# Patient Record
Sex: Male | Born: 1960 | Race: White | Hispanic: No | Marital: Married | Smoking: Never smoker
Health system: Southern US, Community
[De-identification: ages and names within clinical notes are randomized; demographics above are authoritative.]

## PROBLEM LIST (undated history)

## (undated) DIAGNOSIS — J45909 Unspecified asthma, uncomplicated: Secondary | ICD-10-CM

## (undated) DIAGNOSIS — K5190 Ulcerative colitis, unspecified, without complications: Secondary | ICD-10-CM

## (undated) DIAGNOSIS — E119 Type 2 diabetes mellitus without complications: Secondary | ICD-10-CM

## (undated) HISTORY — PX: UMBILICAL HERNIA REPAIR: SHX196

## (undated) HISTORY — PX: HERNIA REPAIR: SHX51

## (undated) HISTORY — PX: OTHER SURGICAL HISTORY: SHX169

---

## 2006-04-22 ENCOUNTER — Ambulatory Visit (HOSPITAL_BASED_OUTPATIENT_CLINIC_OR_DEPARTMENT_OTHER): Admission: RE | Admit: 2006-04-22 | Discharge: 2006-04-22 | Payer: Self-pay | Admitting: Surgery

## 2006-04-22 ENCOUNTER — Ambulatory Visit (HOSPITAL_COMMUNITY): Admission: AD | Admit: 2006-04-22 | Discharge: 2006-04-23 | Payer: Self-pay | Admitting: Surgery

## 2010-12-03 ENCOUNTER — Emergency Department (HOSPITAL_COMMUNITY): Payer: BC Managed Care – PPO

## 2010-12-03 ENCOUNTER — Observation Stay (HOSPITAL_COMMUNITY)
Admission: EM | Admit: 2010-12-03 | Discharge: 2010-12-05 | Disposition: A | Discharge status: Home / Self Care | Payer: BC Managed Care – PPO | Source: Ambulatory Visit | Attending: Infectious Diseases | Admitting: Infectious Diseases

## 2010-12-03 DIAGNOSIS — R197 Diarrhea, unspecified: Secondary | ICD-10-CM | POA: Insufficient documentation

## 2010-12-03 DIAGNOSIS — K2940 Chronic atrophic gastritis without bleeding: Secondary | ICD-10-CM | POA: Insufficient documentation

## 2010-12-03 DIAGNOSIS — R609 Edema, unspecified: Secondary | ICD-10-CM | POA: Insufficient documentation

## 2010-12-03 DIAGNOSIS — K921 Melena: Secondary | ICD-10-CM

## 2010-12-03 DIAGNOSIS — K259 Gastric ulcer, unspecified as acute or chronic, without hemorrhage or perforation: Principal | ICD-10-CM | POA: Insufficient documentation

## 2010-12-03 DIAGNOSIS — R1013 Epigastric pain: Secondary | ICD-10-CM | POA: Insufficient documentation

## 2010-12-03 DIAGNOSIS — R5381 Other malaise: Secondary | ICD-10-CM | POA: Insufficient documentation

## 2010-12-03 DIAGNOSIS — K922 Gastrointestinal hemorrhage, unspecified: Secondary | ICD-10-CM

## 2010-12-03 DIAGNOSIS — M2550 Pain in unspecified joint: Secondary | ICD-10-CM | POA: Insufficient documentation

## 2010-12-03 DIAGNOSIS — J45909 Unspecified asthma, uncomplicated: Secondary | ICD-10-CM | POA: Insufficient documentation

## 2010-12-03 DIAGNOSIS — Z23 Encounter for immunization: Secondary | ICD-10-CM | POA: Insufficient documentation

## 2010-12-03 LAB — CBC
HCT: 41.3 % (ref 39.0–52.0)
Hemoglobin: 14.7 g/dL (ref 13.0–17.0)
WBC: 7.5 10*3/uL (ref 4.0–10.5)

## 2010-12-03 LAB — COMPREHENSIVE METABOLIC PANEL
ALT: 16 U/L (ref 0–53)
AST: 29 U/L (ref 0–37)
Calcium: 9.1 mg/dL (ref 8.4–10.5)
GFR calc Af Amer: >60 mL/min (ref >60)
Glucose, Bld: 96 mg/dL (ref 70–99)
Sodium: 140 mEq/L (ref 135–145)
Total Protein: 7.3 g/dL (ref 6.0–8.3)

## 2010-12-03 LAB — POCT CARDIAC MARKERS
CKMB, poc: <1 ng/mL — ABNORMAL LOW (ref 1.0–8.0)
Myoglobin, poc: 54.2 ng/mL (ref 12–200)
Troponin i, poc: <0.05 ng/mL (ref 0.00–0.09)

## 2010-12-03 LAB — DIFFERENTIAL
Basophils Absolute: 0 10*3/uL (ref 0.0–0.1)
Lymphocytes Relative: 29 % (ref 12–46)
Neutro Abs: 4.4 10*3/uL (ref 1.7–7.7)

## 2010-12-03 LAB — TYPE AND SCREEN

## 2010-12-03 LAB — ABO/RH: ABO/RH(D): A NEG

## 2010-12-03 LAB — HIV ANTIBODY (ROUTINE TESTING W REFLEX): HIV: NONREACTIVE

## 2010-12-03 LAB — LIPASE, BLOOD: Lipase: 30 U/L (ref 11–59)

## 2010-12-04 ENCOUNTER — Other Ambulatory Visit: Payer: Self-pay | Admitting: Gastroenterology

## 2010-12-04 LAB — CARDIAC PANEL(CRET KIN+CKTOT+MB+TROPI)
Relative Index: INVALID (ref 0.0–2.5)
Total CK: 77 U/L (ref 7–232)
Troponin I: 0.01 ng/mL (ref 0.00–0.06)

## 2010-12-04 LAB — RAPID URINE DRUG SCREEN, HOSP PERFORMED
Amphetamines: NOT DETECTED
Barbiturates: NOT DETECTED
Cocaine: NOT DETECTED
Tetrahydrocannabinol: NOT DETECTED

## 2010-12-04 LAB — CBC
HCT: 38.7 % — ABNORMAL LOW (ref 39.0–52.0)
Hemoglobin: 13.2 g/dL (ref 13.0–17.0)
MCHC: 34.1 g/dL (ref 30.0–36.0)
RDW: 13.1 % (ref 11.5–15.5)

## 2010-12-04 LAB — BASIC METABOLIC PANEL
BUN: 6 mg/dL (ref 6–23)
CO2: 29 mEq/L (ref 19–32)
Chloride: 106 mEq/L (ref 96–112)
Creatinine, Ser: 1.06 mg/dL (ref 0.4–1.5)
Glucose, Bld: 104 mg/dL — ABNORMAL HIGH (ref 70–99)

## 2010-12-04 LAB — IGM: IgM, Serum: 52 mg/dL — ABNORMAL LOW (ref 60–263)

## 2010-12-05 LAB — CBC
MCH: 30.1 pg (ref 26.0–34.0)
MCHC: 34.4 g/dL (ref 30.0–36.0)
MCV: 87.3 fL (ref 78.0–100.0)
Platelets: 121 10*3/uL — ABNORMAL LOW (ref 150–400)
RDW: 13 % (ref 11.5–15.5)

## 2010-12-10 NOTE — Op Note (Signed)
  [name], [name]               ACCOUNT NO.:  [id number]  MEDICAL RECORD NO.:  [id number]           PATIENT TYPE:  I  LOCATION:  5019                         FACILITY:  [facility]  PHYSICIAN:  Elnoria Livingston L. Malon Kindle., M.D.DATE OF BIRTH:  1961/07/27  DATE OF PROCEDURE:  12/04/2010 DATE OF DISCHARGE:                              OPERATIVE REPORT   PROCEDURE:  Esophagogastroduodenoscopy and biopsy.  SURGEON:  Christoper Bushey L. Jermiya Reichl, MD  MEDICATIONS:  Cetacaine spray, fentanyl 100 mcg, Versed 8 mg IV.  INDICATIONS:  This is an young man who has been taking a lot of Aleve. He has had some maroonish stools with burning epigastric discomfort.  DESCRIPTION OF PROCEDURE:  Procedure had been explained to the patient and consent obtained.  In the left lateral decubitus position, the Pentax upper scope was inserted and advanced under direct visualization. The esophagus was normal.  There was no ulceration or inflammation or signs of esophagitis and no esophageal varices.  The stomach was entered.  There was no active bleeding in the stomach.  At the antral area were multiple linear shallow-type ulcers.  None were actively bleeding.  Several were removed with biopsies at the edges.  The pyloric channel was normal and the duodenum was normal as well.  The scope was withdrawn and initial findings were confirmed.  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  PLAN:  We will have the patient avoid Aleve and other nonsteroidal and we will treat him for several weeks with proton pump inhibitors and follow up in 6-8 weeks with repeat EGD or upper GI.          ______________________________ [name]. Malon Kindle., M.D.     Waldron Session  D:  12/04/2010  T:  12/05/2010  Job:  [id number]  cc:   Aida Puffer  Electronically Signed by Carman Ching M.D. on 12/10/2010 03:20:12 PM

## 2010-12-12 NOTE — Consult Note (Signed)
  [name], [name]               ACCOUNT NO.:  [id number]  MEDICAL RECORD NO.:  [id number]           PATIENT TYPE:  LOCATION:                                 FACILITY:  PHYSICIAN:  Keigo Whalley L. Malon Kindle., M.D.DATE OF BIRTH:  1961/10/07  DATE OF CONSULTATION:  12/04/2010 DATE OF DISCHARGE:                                CONSULTATION   Requested by medical teaching service.  PRIMARY CARE PHYSICIAN:  Dr. Aida Puffer.  REASON FOR CONSULTATION:  GI bleeding.  HISTORY:  The patient came to the emergency room with several loose bowel movements with severe epigastric burning and bloating.  He apparently has had several days of melenic stools and has been using Aleve.  For the past 6 to 8 months, he has had progressive aches and pains, has been taking Aleve 3 to 5 days, approximately 4 tablets a day. He has had an episode of melena and some bright blood a couple of months ago but has had black and maroony stools over the past several days, 3 to 4 bowel movements a day.  He has had some mild fatigue.  He has not really been on any other medications other than albuterol and Flovent inhalers p.r.n. for asthma.  His hemoglobin on admission was 14.7, dropped to 13.2 this morning.  The patient's stools were apparently guaiac negative on exam.  He has been started on Zofran and Protonix. He has had burning epigastric pain for the past several weeks intermittently, it comes and goes.  CURRENT MEDICATIONS:  Flovent and albuterol inhalers p.r.n.  ALLERGIES:  He has no drug allergies.  MEDICAL HISTORY:  He has had a previous umbilical hernia repair with mesh and has intermittent asthma.  No other chronic medical problems.  SOCIAL HISTORY:  He drinks 2 to 3 beers a day, he has never smoked. Works as a Landscape architect.  He is married.  FAMILY HISTORY:  The family history is significant for coronary artery disease, hypertension, dialysis in his mother.  No family history of GI cancer or  IBD.  PHYSICAL EXAMINATION:  VITAL SIGNS:  Currently stable. GENERAL:  White male in no acute distress. HEENT:  Sclerae nonicteric. LUNGS:  Clear. HEART:  Regular rate and rhythm without murmurs or gallops. ABDOMEN:  Soft, slightly distended, obese with good bowel sounds.  He has diastasis recti and has tenderness right in that area, although there is no incarceration of the hernia.  ASSESSMENT:  Abdominal pain and recent melena - this is probably an nonsteroidal antiinflammatory drug induced ulcer.  PLAN:  We will go ahead with an endoscopy today and we keep him n.p.o. for now.  I discussed this with the patient, he is agreeable.          ______________________________ [name]. Malon Kindle., M.D.     Waldron Session  D:  12/04/2010  T:  12/05/2010  Job:  [id number]  cc:   Aida Puffer  Electronically Signed by Carman Ching M.D. on 12/12/2010 01:40:57 PM
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
## 2011-02-05 ENCOUNTER — Other Ambulatory Visit: Payer: Self-pay | Admitting: Gastroenterology

## 2011-02-28 ENCOUNTER — Ambulatory Visit
Admission: RE | Admit: 2011-02-28 | Discharge: 2011-02-28 | Disposition: A | Discharge status: Home / Self Care | Payer: BC Managed Care – PPO | Source: Ambulatory Visit | Attending: Gastroenterology | Admitting: Gastroenterology

## 2011-02-28 ENCOUNTER — Other Ambulatory Visit: Payer: Self-pay | Admitting: Gastroenterology

## 2011-02-28 DIAGNOSIS — K5900 Constipation, unspecified: Secondary | ICD-10-CM

## 2011-06-19 ENCOUNTER — Inpatient Hospital Stay (HOSPITAL_COMMUNITY)
Admission: AD | Admit: 2011-06-19 | Discharge: 2011-06-22 | DRG: 179 | Disposition: A | Discharge status: Home / Self Care | Payer: BC Managed Care – PPO | Source: Ambulatory Visit | Attending: Gastroenterology | Admitting: Gastroenterology

## 2011-06-19 DIAGNOSIS — J45909 Unspecified asthma, uncomplicated: Secondary | ICD-10-CM | POA: Diagnosis present

## 2011-06-19 DIAGNOSIS — IMO0002 Reserved for concepts with insufficient information to code with codable children: Secondary | ICD-10-CM

## 2011-06-19 DIAGNOSIS — K5100 Ulcerative (chronic) pancolitis without complications: Principal | ICD-10-CM | POA: Diagnosis present

## 2011-06-19 DIAGNOSIS — R110 Nausea: Secondary | ICD-10-CM | POA: Diagnosis present

## 2011-06-19 DIAGNOSIS — R197 Diarrhea, unspecified: Secondary | ICD-10-CM | POA: Diagnosis present

## 2011-06-19 DIAGNOSIS — Z8711 Personal history of peptic ulcer disease: Secondary | ICD-10-CM

## 2011-06-19 LAB — COMPREHENSIVE METABOLIC PANEL
Alkaline Phosphatase: 44 U/L (ref 39–117)
BUN: 15 mg/dL (ref 6–23)
CO2: 29 mEq/L (ref 19–32)
Chloride: 104 mEq/L (ref 96–112)
GFR calc Af Amer: >60 mL/min (ref >60)
Glucose, Bld: 95 mg/dL (ref 70–99)
Potassium: 3.9 mEq/L (ref 3.5–5.1)
Total Bilirubin: 0.3 mg/dL (ref 0.3–1.2)

## 2011-06-19 LAB — CBC
HCT: 36.8 % — ABNORMAL LOW (ref 39.0–52.0)
Hemoglobin: 12 g/dL — ABNORMAL LOW (ref 13.0–17.0)
WBC: 6.5 10*3/uL (ref 4.0–10.5)

## 2011-06-20 LAB — CLOSTRIDIUM DIFFICILE BY PCR: Toxigenic C. Difficile by PCR: NEGATIVE

## 2011-06-22 LAB — CBC
HCT: 41.8 % (ref 39.0–52.0)
MCHC: 32.8 g/dL (ref 30.0–36.0)
RDW: 15.1 % (ref 11.5–15.5)

## 2011-07-03 NOTE — H&P (Signed)
  [name], [name] NO.:  [id number]  MEDICAL RECORD NO.:  [id number]  LOCATION:  5122                         FACILITY:  [facility]  PHYSICIAN:  Graylin Shiver, M.D.   DATE OF BIRTH:  [birth year]  DATE OF ADMISSION:  06/19/2011 DATE OF DISCHARGE:                             HISTORY & PHYSICAL   CHIEF COMPLAINT:  Ulcerative colitis.  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 Randa Evens.  He has been off on Apriso for his ulcerative colitis and also prednisone.  He has been having what was felt to be a flare-up with associated diarrhea and because of this Dr. Randa Evens started him on prednisone 20 mg a few weeks ago and this seemed to help his symptoms, however, he tapered the dosage down to 5 mg and as he was tapering down his symptoms of diarrhea got worse and also a couple of days ago he started noticing some rectal bleeding.  He has all some and having some lower abdominal cramping discomfort.  Because of this, Dr. Randa Evens called and felt that the patient should be admitted to the hospital for further treatment.  PAST HISTORY: 1. Peptic ulcer disease. 2. Asthma. 3. Universal ulcerative colitis.  PAST SURGERIES:  Hernia repair.  MEDICATIONS: 1. Glucosamine 1500 complex capsule once a day. 2. Prednisone, he had gotten down to 5 mg daily, but yesterday he took     20 mg. 3. Apriso 0.375 g capsule extended 4 capsules every morning once a     day.  FAMILY HISTORY:  Negative for any GI problems.  SOCIAL HISTORY:  Does not smoke.  Drinks occasional alcohol.  ALLERGIES:  OMEPRAZOLE causes itching.  REVIEW OF SYSTEMS:  No chest pain, shortness of breath, cough, or sputum production.  PHYSICAL EXAMINATION:  GENERAL:  He is alert and oriented.  He does not appear in any acute distress.  He was directly admitted and is ambulatory here in the  hospital. SKIN:  Nonicteric. HEART:  Regular rhythm.  No murmurs. LUNGS:  Clear. ABDOMEN:  Bowel sounds normal, soft, nontender.  No hepatosplenomegaly. EXTREMITIES:  No edema.  IMPRESSION:  Ulcerative colitis with flare-up.  PLAN:  The patient is being admitted to the hospital for more aggressive treatment.  This will include IV fluids and also I will start him on Solu-Medrol 40 mg IV q.8 h. until his symptoms seemed to improve and we will follow him clinically.          ______________________________ Graylin Shiver, M.D.     SFG/MEDQ  D:  06/19/2011  T:  06/19/2011  Job:  [id number]  cc:   Fayrene Fearing L. Malon Kindle., M.D. Aida Puffer, MD  Electronically Signed by Herbert Moors MD on 07/03/2011 08:55:30 AM

## 2011-07-10 NOTE — Discharge Summary (Signed)
  [name], COVEN NO.:  [id number]  MEDICAL RECORD NO.:  [id number]  LOCATION:  5122                         FACILITY:  [facility]  PHYSICIAN:  Shirley Friar, MDDATE OF BIRTH:  May 01, 1961  DATE OF ADMISSION:  06/19/2011 DATE OF DISCHARGE:  06/22/2011                              DISCHARGE SUMMARY   DISCHARGE DIAGNOSIS:  Ulcerative colitis flare.  HISTORY OF PRESENT ILLNESS:  [name] is a 50 year old white male, who was diagnosed of universal ulcerative colitis in April 2012 by Dr. Randa Evens and had been on Apriso 1.5 grams per day.  He had been on prednisone taper that started few weeks ago when he developed a recurrent symptoms and when tapering down to 5 mg.  He developed severe diarrhea and some rectal bleeding.  He was admitted for further management of this presumed ulcerative colitis flare.  HOSPITAL COURSE:  On presentation, he had a normal white blood count of 6.5, hemoglobin of 12.0, and platelet count of 157.  His electrolytes were all within normal limits.  He was placed on Solu-Medrol 40 mg IV q.8 h. and aggressive IV fluids.  He had a negative C. diff PCR.  He did well with IV steroids and IV fluids and his diarrhea resolved. On day of discharge, he was having formed stools without any abdominal pain, nausea, and vomiting.  He was tolerating a bland diet on the day of discharge.  Repeat CBC will be ordered prior to discharge and that is pending at this time.  DISCHARGE CONDITION:  Improved.  DISCHARGE ACTIVITY:  Increase activity slowly.  DISCHARGE DIET:  Low-fat diet/bland diet.  DISCHARGE MEDICATIONS: 1. Prednisone 60 mg p.o. daily. 2. Apriso 1.5 g p.o. daily. 3. Ondansetron 4 mg p.o. q.8 h. as needed.  DISCHARGE INSTRUCTIONS:  Return to work in 1 week as tolerated.  DISCHARGE FOLLOWUP:  Follow up with Dr. Carman Ching at [facility] in 1-2 weeks.  Discharge to home.     Shirley Friar,  MD     VCS/MEDQ  D:  06/22/2011  T:  06/22/2011  Job:  [id number]  cc:   Fayrene Fearing L. Malon Kindle., M.D. Graylin Shiver, M.D.  Electronically Signed by Charlott Rakes MD on 07/10/2011 10:55:10 AM

## 2012-08-19 ENCOUNTER — Other Ambulatory Visit (HOSPITAL_COMMUNITY): Payer: Self-pay | Admitting: *Deleted

## 2012-08-20 ENCOUNTER — Encounter (HOSPITAL_COMMUNITY)
Admission: RE | Admit: 2012-08-20 | Discharge: 2012-08-20 | Disposition: A | Discharge status: Home / Self Care | Payer: BC Managed Care – PPO | Source: Ambulatory Visit | Attending: Gastroenterology | Admitting: Gastroenterology

## 2012-08-20 DIAGNOSIS — K5100 Ulcerative (chronic) pancolitis without complications: Secondary | ICD-10-CM | POA: Insufficient documentation

## 2012-08-20 MED ORDER — ACETAMINOPHEN 325 MG PO TABS
ORAL_TABLET | ORAL | Status: AC
  Administered 2012-08-20: 350 mg
  Filled 2012-08-20: qty 2

## 2012-08-20 MED ORDER — SODIUM CHLORIDE 0.9 % IV SOLN
700.0000 mg | INTRAVENOUS | Status: DC
  Administered 2012-08-20: 700 mg via INTRAVENOUS
  Filled 2012-08-20: qty 70

## 2012-08-20 MED ORDER — ACETAMINOPHEN 500 MG PO TABS: 1000.0000 mg | ORAL_TABLET | ORAL | Status: DC

## 2012-08-20 MED ORDER — SODIUM CHLORIDE 0.9 % IV SOLN
INTRAVENOUS | Status: DC
  Administered 2012-08-20: 09:00:00 via INTRAVENOUS

## 2012-09-02 ENCOUNTER — Other Ambulatory Visit (HOSPITAL_COMMUNITY): Payer: Self-pay | Admitting: *Deleted

## 2012-09-03 ENCOUNTER — Encounter (HOSPITAL_COMMUNITY)
Admission: RE | Admit: 2012-09-03 | Discharge: 2012-09-03 | Disposition: A | Discharge status: Home / Self Care | Payer: BC Managed Care – PPO | Source: Ambulatory Visit | Attending: Gastroenterology | Admitting: Gastroenterology

## 2012-09-03 DIAGNOSIS — K5100 Ulcerative (chronic) pancolitis without complications: Secondary | ICD-10-CM | POA: Insufficient documentation

## 2012-09-03 MED ORDER — SODIUM CHLORIDE 0.9 % IV SOLN
INTRAVENOUS | Status: AC
  Administered 2012-09-03: 10:00:00 via INTRAVENOUS

## 2012-09-03 MED ORDER — SODIUM CHLORIDE 0.9 % IV SOLN
700.0000 mg | INTRAVENOUS | Status: AC
  Administered 2012-09-03: 700 mg via INTRAVENOUS
  Filled 2012-09-03: qty 70

## 2012-09-03 MED ORDER — ACETAMINOPHEN 500 MG PO TABS
ORAL_TABLET | ORAL | Status: AC
  Administered 2012-09-03: 1000 mg via ORAL
  Filled 2012-09-03: qty 2

## 2012-09-03 MED ORDER — ACETAMINOPHEN 500 MG PO TABS
1000.0000 mg | ORAL_TABLET | ORAL | Status: AC
  Administered 2012-09-03: 1000 mg via ORAL

## 2012-10-15 ENCOUNTER — Other Ambulatory Visit (HOSPITAL_COMMUNITY): Payer: Self-pay | Admitting: *Deleted

## 2012-10-16 ENCOUNTER — Encounter (HOSPITAL_COMMUNITY)
Admission: RE | Admit: 2012-10-16 | Discharge: 2012-10-16 | Disposition: A | Discharge status: Home / Self Care | Payer: BC Managed Care – PPO | Source: Ambulatory Visit | Attending: Gastroenterology | Admitting: Gastroenterology

## 2012-10-16 DIAGNOSIS — K5100 Ulcerative (chronic) pancolitis without complications: Secondary | ICD-10-CM | POA: Insufficient documentation

## 2012-10-16 MED ORDER — SODIUM CHLORIDE 0.9 % IV SOLN
700.0000 mg | INTRAVENOUS | Status: DC
  Administered 2012-10-16: 700 mg via INTRAVENOUS
  Filled 2012-10-16: qty 70

## 2012-10-16 MED ORDER — ACETAMINOPHEN 500 MG PO TABS
1000.0000 mg | ORAL_TABLET | ORAL | Status: DC
  Administered 2012-10-16: 1000 mg via ORAL

## 2012-10-16 MED ORDER — SODIUM CHLORIDE 0.9 % IV SOLN
INTRAVENOUS | Status: DC
  Administered 2012-10-16: 250 mL via INTRAVENOUS

## 2012-10-16 MED ORDER — ACETAMINOPHEN 500 MG PO TABS
ORAL_TABLET | ORAL | Status: AC
  Filled 2012-10-16: qty 2

## 2012-12-17 ENCOUNTER — Other Ambulatory Visit (HOSPITAL_COMMUNITY): Payer: Self-pay | Admitting: *Deleted

## 2012-12-18 ENCOUNTER — Encounter (HOSPITAL_COMMUNITY)
Admission: RE | Admit: 2012-12-18 | Discharge: 2012-12-18 | Disposition: A | Discharge status: Home / Self Care | Payer: BC Managed Care – PPO | Source: Ambulatory Visit | Attending: Gastroenterology | Admitting: Gastroenterology

## 2012-12-18 DIAGNOSIS — K5100 Ulcerative (chronic) pancolitis without complications: Secondary | ICD-10-CM | POA: Insufficient documentation

## 2012-12-18 MED ORDER — SODIUM CHLORIDE 0.9 % IV SOLN
700.0000 mg | INTRAVENOUS | Status: AC
  Administered 2012-12-18: 700 mg via INTRAVENOUS
  Filled 2012-12-18: qty 70

## 2012-12-18 MED ORDER — ACETAMINOPHEN 500 MG PO TABS: 1000.0000 mg | ORAL_TABLET | ORAL | Status: DC

## 2012-12-18 MED ORDER — SODIUM CHLORIDE 0.9 % IV SOLN: INTRAVENOUS | Status: DC

## 2012-12-18 MED ORDER — ACETAMINOPHEN 500 MG PO TABS
ORAL_TABLET | ORAL | Status: AC
  Filled 2012-12-18: qty 2

## 2012-12-18 MED ORDER — SODIUM CHLORIDE 0.9 % IV SOLN: 700.0000 mg | INTRAVENOUS | Status: DC

## 2012-12-18 MED ORDER — ACETAMINOPHEN 500 MG PO TABS
1000.0000 mg | ORAL_TABLET | ORAL | Status: AC
Start: 2012-12-18 — End: 2012-12-18
  Administered 2012-12-18: 1000 mg via ORAL

## 2012-12-29 ENCOUNTER — Emergency Department (HOSPITAL_COMMUNITY): Payer: BC Managed Care – PPO

## 2012-12-29 ENCOUNTER — Encounter (HOSPITAL_COMMUNITY): Payer: Self-pay | Admitting: Emergency Medicine

## 2012-12-29 ENCOUNTER — Emergency Department (HOSPITAL_COMMUNITY)
Admission: EM | Admit: 2012-12-29 | Discharge: 2012-12-29 | Disposition: A | Discharge status: Home / Self Care | Payer: BC Managed Care – PPO | Attending: Emergency Medicine | Admitting: Emergency Medicine

## 2012-12-29 DIAGNOSIS — N1330 Unspecified hydronephrosis: Secondary | ICD-10-CM | POA: Insufficient documentation

## 2012-12-29 DIAGNOSIS — N201 Calculus of ureter: Secondary | ICD-10-CM | POA: Insufficient documentation

## 2012-12-29 DIAGNOSIS — K5190 Ulcerative colitis, unspecified, without complications: Secondary | ICD-10-CM | POA: Insufficient documentation

## 2012-12-29 DIAGNOSIS — R42 Dizziness and giddiness: Secondary | ICD-10-CM | POA: Insufficient documentation

## 2012-12-29 DIAGNOSIS — M549 Dorsalgia, unspecified: Secondary | ICD-10-CM | POA: Insufficient documentation

## 2012-12-29 DIAGNOSIS — J45909 Unspecified asthma, uncomplicated: Secondary | ICD-10-CM | POA: Insufficient documentation

## 2012-12-29 DIAGNOSIS — Z9889 Other specified postprocedural states: Secondary | ICD-10-CM | POA: Insufficient documentation

## 2012-12-29 DIAGNOSIS — R110 Nausea: Secondary | ICD-10-CM | POA: Insufficient documentation

## 2012-12-29 DIAGNOSIS — IMO0002 Reserved for concepts with insufficient information to code with codable children: Secondary | ICD-10-CM | POA: Insufficient documentation

## 2012-12-29 DIAGNOSIS — Z79899 Other long term (current) drug therapy: Secondary | ICD-10-CM | POA: Insufficient documentation

## 2012-12-29 HISTORY — DX: Ulcerative colitis, unspecified, without complications: K51.90

## 2012-12-29 HISTORY — DX: Unspecified asthma, uncomplicated: J45.909

## 2012-12-29 LAB — BASIC METABOLIC PANEL
GFR calc Af Amer: >90 mL/min (ref >90)
GFR calc non Af Amer: >90 mL/min (ref >90)
Glucose, Bld: 113 mg/dL — ABNORMAL HIGH (ref 70–99)
Potassium: 4.2 mEq/L (ref 3.5–5.1)
Sodium: 139 mEq/L (ref 135–145)

## 2012-12-29 LAB — URINE MICROSCOPIC-ADD ON

## 2012-12-29 LAB — URINALYSIS, ROUTINE W REFLEX MICROSCOPIC
Leukocytes, UA: NEGATIVE
Nitrite: NEGATIVE
Specific Gravity, Urine: 1.017 (ref 1.005–1.030)
Urobilinogen, UA: 0.2 mg/dL (ref 0.0–1.0)

## 2012-12-29 LAB — CBC WITH DIFFERENTIAL/PLATELET
Basophils Absolute: 0 10*3/uL (ref 0.0–0.1)
Eosinophils Absolute: 0.5 10*3/uL (ref 0.0–0.7)
Lymphs Abs: 1.5 10*3/uL (ref 0.7–4.0)
MCH: 32.2 pg (ref 26.0–34.0)
Neutrophils Relative %: 50 % (ref 43–77)
Platelets: DECREASED 10*3/uL (ref 150–400)
RBC: 5.21 MIL/uL (ref 4.22–5.81)
WBC: 4.7 10*3/uL (ref 4.0–10.5)

## 2012-12-29 MED ORDER — OXYCODONE-ACETAMINOPHEN 5-325 MG PO TABS: 1.0000 | ORAL_TABLET | ORAL | Status: DC | PRN

## 2012-12-29 MED ORDER — TAMSULOSIN HCL 0.4 MG PO CAPS: 0.4000 mg | ORAL_CAPSULE | Freq: Every day | ORAL | Status: DC

## 2012-12-29 MED ORDER — PROMETHAZINE HCL 25 MG PO TABS: 25.0000 mg | ORAL_TABLET | Freq: Four times a day (QID) | ORAL | Status: DC | PRN

## 2012-12-29 MED ORDER — HYDROMORPHONE HCL PF 1 MG/ML IJ SOLN
1.0000 mg | Freq: Once | INTRAMUSCULAR | Status: AC
  Administered 2012-12-29: 1 mg via INTRAVENOUS
  Filled 2012-12-29: qty 1

## 2012-12-29 MED ORDER — MORPHINE SULFATE 4 MG/ML IJ SOLN
4.0000 mg | Freq: Once | INTRAMUSCULAR | Status: AC
  Administered 2012-12-29: 4 mg via INTRAVENOUS
  Filled 2012-12-29: qty 1

## 2012-12-29 MED ORDER — PROMETHAZINE HCL 25 MG/ML IJ SOLN
12.5000 mg | Freq: Once | INTRAMUSCULAR | Status: AC
  Administered 2012-12-29: 12.5 mg via INTRAVENOUS
  Filled 2012-12-29 (×2): qty 1

## 2012-12-29 MED ORDER — ONDANSETRON HCL 4 MG/2ML IJ SOLN
4.0000 mg | Freq: Once | INTRAMUSCULAR | Status: AC
  Administered 2012-12-29: 4 mg via INTRAVENOUS
  Filled 2012-12-29: qty 2

## 2012-12-29 NOTE — ED Provider Notes (Signed)
History     CSN: [id number]  Arrival date & time 12/29/12  0548   First MD Initiated Contact with Patient 12/29/12 [phone]      Chief Complaint  Patient presents with  . Flank Pain  . Nausea    (Consider location/radiation/quality/duration/timing/severity/associated sxs/prior treatment) HPI Comments: 52 yo male with history of ulcerative colitis and asthma, presents to the ED today with LLQ abdominal pain radiating to the left lower back.  Pain began yesterday as a dull ache in the LLQ and has progressively worsened to a severe ache radiating to the back.  Pain has been constant since onset and gradually worsening.  He has not tried any medications for the pain.  States he "has never had a pain like this before."  Pain is an 8/10 currently. Most recent colonoscopy last year without history of diverticulosis. He also reports having a remote history of kidney stone and that this pain is different.   Patient is a 52 y.o. male presenting with flank pain. The history is provided by the patient. No language interpreter was used.  Flank Pain This is a new problem. The current episode started yesterday. The problem occurs constantly. The problem has been gradually worsening. Associated symptoms include abdominal pain and nausea. Pertinent negatives include no change in bowel habit, chest pain, chills, fever, myalgias, neck pain, numbness, rash, urinary symptoms, vomiting or weakness. Nothing aggravates the symptoms. He has tried nothing for the symptoms.    Past Medical History  Diagnosis Date  . Colitis, ulcerative chronic   . Asthma     Past Surgical History  Procedure Laterality Date  . Hernia repair      History reviewed. No pertinent family history.  History  Substance Use Topics  . Smoking status: Never Smoker   . Smokeless tobacco: Never Used  . Alcohol Use: 1.2 oz/week    2 Cans of beer per week      Review of Systems  Constitutional: Negative for fever and chills.  HENT:  Negative for neck pain.   Cardiovascular: Negative for chest pain.  Gastrointestinal: Positive for nausea and abdominal pain. Negative for vomiting and change in bowel habit.  Genitourinary: Positive for flank pain. Negative for dysuria, hematuria, scrotal swelling and testicular pain.  Musculoskeletal: Positive for back pain. Negative for myalgias.  Skin: Negative for rash.  Neurological: Positive for light-headedness. Negative for weakness and numbness.  Hematological: Does not bruise/bleed easily.  All other systems reviewed and are negative.    Allergies  Review of patient's allergies indicates no known allergies.  Home Medications  No current outpatient prescriptions on file.  BP 165/98  Pulse 56  Temp(Src) 98.3 F (36.8 C)  Resp 12  SpO2 96%  Physical Exam  Constitutional: He is oriented to person, place, and time. He appears well-developed and well-nourished.  HENT:  Head: Normocephalic and atraumatic.  Cardiovascular: Normal rate, regular rhythm, normal heart sounds and intact distal pulses.  Exam reveals no gallop and no friction rub.   No murmur heard. Pulmonary/Chest: Effort normal and breath sounds normal. No respiratory distress. He has no wheezes. He has no rales. He exhibits no tenderness.  Abdominal: Soft. Bowel sounds are normal. He exhibits no distension and no mass. There is tenderness in the left lower quadrant. There is no rebound, no guarding and no CVA tenderness.    LLQ tenderness with pain radiating to left lower to middle back.  Musculoskeletal: Normal range of motion. He exhibits tenderness.  Lumbar back: He exhibits tenderness. He exhibits normal range of motion and no bony tenderness.       Back:  Neurological: He is alert and oriented to person, place, and time.  Skin: Skin is warm and dry.    ED Course  Procedures (including critical care time) Results for orders placed during the hospital encounter of 12/29/12  CBC WITH DIFFERENTIAL        Result Value Range   WBC 4.7  4.0 - 10.5 K/uL   RBC 5.21  4.22 - 5.81 MIL/uL   Hemoglobin 16.8  13.0 - 17.0 g/dL   HCT 78.2  95.6 - 21.3 %   MCV 88.9  78.0 - 100.0 fL   MCH 32.2  26.0 - 34.0 pg   MCHC 36.3 (*) 30.0 - 36.0 g/dL   RDW 08.6  57.8 - 46.9 %   Platelets    150 - 400 K/uL   Value: PLATELET CLUMPS NOTED ON SMEAR, COUNT APPEARS DECREASED   Neutrophils Relative 50  43 - 77 %   Neutro Abs 2.3  1.7 - 7.7 K/uL   Lymphocytes Relative 31  12 - 46 %   Lymphs Abs 1.5  0.7 - 4.0 K/uL   Monocytes Relative 9  3 - 12 %   Monocytes Absolute 0.4  0.1 - 1.0 K/uL   Eosinophils Relative 10 (*) 0 - 5 %   Eosinophils Absolute 0.5  0.0 - 0.7 K/uL   Basophils Relative 1  0 - 1 %   Basophils Absolute 0.0  0.0 - 0.1 K/uL  BASIC METABOLIC PANEL      Result Value Range   Sodium 139  135 - 145 mEq/L   Potassium 4.2  3.5 - 5.1 mEq/L   Chloride 104  96 - 112 mEq/L   CO2 26  19 - 32 mEq/L   Glucose, Bld 113 (*) 70 - 99 mg/dL   BUN 14  6 - 23 mg/dL   Creatinine, Ser 6.29  0.50 - 1.35 mg/dL   Calcium 8.9  8.4 - 52.8 mg/dL   GFR calc non Af Amer >90  >90 mL/min   GFR calc Af Amer >90  >90 mL/min  URINALYSIS, ROUTINE W REFLEX MICROSCOPIC      Result Value Range   Color, Urine YELLOW  YELLOW   APPearance CLEAR  CLEAR   Specific Gravity, Urine 1.017  1.005 - 1.030   pH 6.0  5.0 - 8.0   Glucose, UA NEGATIVE  NEGATIVE mg/dL   Hgb urine dipstick MODERATE (*) NEGATIVE   Bilirubin Urine NEGATIVE  NEGATIVE   Ketones, ur NEGATIVE  NEGATIVE mg/dL   Protein, ur NEGATIVE  NEGATIVE mg/dL   Urobilinogen, UA 0.2  0.0 - 1.0 mg/dL   Nitrite NEGATIVE  NEGATIVE   Leukocytes, UA NEGATIVE  NEGATIVE  URINE MICROSCOPIC-ADD ON      Result Value Range   WBC, UA 0-2  <3 WBC/hpf   RBC / HPF 11-20  <3 RBC/hpf   Urine-Other MUCOUS PRESENT     Ct Abdomen Pelvis Wo Contrast  12/29/2012  *RADIOLOGY REPORT*  Clinical Data: Flank pain and nausea  CT ABDOMEN AND PELVIS WITHOUT CONTRAST  Technique:  Multidetector CT  imaging of the abdomen and pelvis was performed following the standard protocol without intravenous contrast.  Comparison: None  Findings: Lung bases:  The lung bases are clear.  No pericardial or pleural effusion  Abdomen/pelvis:  The liver is diffusely nodular compatible with cirrhosis.  The gallbladder appears  normal.  No biliary dilatation. Normal appearance of the pancreas.  The spleen is enlarged measuring 16.2 cm IN craniocaudal dimension.  The adrenal glands are both unremarkable.  bilateral renal cysts of varying densities are identified consistent with polycystic kidney disease.  Parenchymal calcifications are noted within both kidneys. There are two small right renal stones.  The largest right renal stone measures 1-2 mm is and is in the inferior pole the right kidney, image 42. There is no right-sided hydronephrosis or hydroureter.  Within the proximal left ureter there is a stone which measures 5 mm, image number 55.  This results in a mild left- sided pelvocaliectasis.  The urinary bladder is normal.  The prostate gland and seminal vesicles are unremarkable.  Mild calcified atherosclerotic change affects the abdominal aorta and its branches.  There is no upper abdominal adenopathy.  No pelvic or inguinal adenopathy noted.  The stomach and the small bowel loops are unremarkable.  The appendix is visualized and appears normal.  Normal appearance of the colon.  Left upper quadrant varices identified.  Bones/Musculoskeletal:  Review of the visualized osseous structures is significant for mild spondylosis.  IMPRESSION:  1.  Proximal left ureteral stone measures 5 mm.  This results in mild left-sided pelvocaliectasis. 2.  Punctate nonobstructing right renal calculi. 3.  Imaging features compatible with polycystic kidney disease. These cysts are incompletely characterized without IV contrast material. 4.  Morphologic features of the liver compatible with cirrhosis and portal venous hypertension.   Original  Report Authenticated By: Signa Kell, M.D.    Labs Reviewed - No data to display No results found.   No diagnosis found. 1. Left ureteral stone 2. Hydronephrosis     MDM  Pain is controlled with IV Dilaudid and nausea is resolved after Phenergan (Zofran did not work). Discussed importance of urology follow up.        Arnoldo Hooker, PA-C 12/29/12 1022

## 2012-12-29 NOTE — ED Notes (Signed)
Pt from home, c/o left sided flank pain starting around midnight. Last hour c/o nausea.

## 2012-12-31 NOTE — ED Provider Notes (Signed)
Medical screening examination/treatment/procedure(s) were performed by non-physician practitioner and as supervising physician I was immediately available for consultation/collaboration.   Marquell H Yao, MD 12/31/12 0858 

## 2013-02-18 ENCOUNTER — Other Ambulatory Visit (HOSPITAL_COMMUNITY): Payer: Self-pay | Admitting: *Deleted

## 2013-02-19 ENCOUNTER — Encounter (HOSPITAL_COMMUNITY)
Admission: RE | Admit: 2013-02-19 | Discharge: 2013-02-19 | Disposition: A | Discharge status: Home / Self Care | Payer: BC Managed Care – PPO | Source: Ambulatory Visit | Attending: Gastroenterology | Admitting: Gastroenterology

## 2013-02-19 DIAGNOSIS — K5100 Ulcerative (chronic) pancolitis without complications: Secondary | ICD-10-CM | POA: Insufficient documentation

## 2013-02-19 MED ORDER — ACETAMINOPHEN 500 MG PO TABS
ORAL_TABLET | ORAL | Status: AC
  Filled 2013-02-19: qty 2

## 2013-02-19 MED ORDER — ACETAMINOPHEN 500 MG PO TABS
1000.0000 mg | ORAL_TABLET | ORAL | Status: DC
  Administered 2013-02-19: 1000 mg via ORAL

## 2013-02-19 MED ORDER — SODIUM CHLORIDE 0.9 % IV SOLN
700.0000 mg | INTRAVENOUS | Status: DC
  Administered 2013-02-19: 700 mg via INTRAVENOUS
  Filled 2013-02-19: qty 70

## 2013-02-19 MED ORDER — SODIUM CHLORIDE 0.9 % IV SOLN
INTRAVENOUS | Status: DC
  Administered 2013-02-19: 11:00:00 via INTRAVENOUS

## 2013-04-16 ENCOUNTER — Encounter (HOSPITAL_COMMUNITY)
Admission: RE | Admit: 2013-04-16 | Discharge: 2013-04-16 | Disposition: A | Discharge status: Home / Self Care | Payer: BC Managed Care – PPO | Source: Ambulatory Visit | Attending: Gastroenterology | Admitting: Gastroenterology

## 2013-04-16 DIAGNOSIS — K5100 Ulcerative (chronic) pancolitis without complications: Secondary | ICD-10-CM | POA: Insufficient documentation

## 2013-04-16 MED ORDER — ACETAMINOPHEN 500 MG PO TABS: 1000.0000 mg | ORAL_TABLET | ORAL | Status: AC

## 2013-04-16 MED ORDER — SODIUM CHLORIDE 0.9 % IV SOLN
700.0000 mg | INTRAVENOUS | Status: AC
  Administered 2013-04-16: 700 mg via INTRAVENOUS
  Filled 2013-04-16: qty 70

## 2013-04-16 MED ORDER — SODIUM CHLORIDE 0.9 % IV SOLN
INTRAVENOUS | Status: DC
  Administered 2013-04-16: 10:00:00 via INTRAVENOUS

## 2013-04-16 MED ORDER — DIPHENHYDRAMINE HCL 25 MG PO TABS: 50.0000 mg | ORAL_TABLET | Freq: Once | ORAL | Status: AC

## 2013-04-16 MED ORDER — DIPHENHYDRAMINE HCL 25 MG PO CAPS
ORAL_CAPSULE | ORAL | Status: AC
  Administered 2013-04-16: 50 mg via ORAL
  Filled 2013-04-16: qty 2

## 2013-04-16 MED ORDER — ACETAMINOPHEN 500 MG PO TABS
ORAL_TABLET | ORAL | Status: AC
  Administered 2013-04-16: 1000 mg via ORAL
  Filled 2013-04-16: qty 2

## 2013-04-16 NOTE — Progress Notes (Addendum)
1610 Patient reports a "knot" in mouth and one above eyebrow with itching that began during previous remicade infusion.  He also reports that he had itching during the infusion. All symptoms resolved 1 hour after infusion. Dr Randa Evens advised.  Orders received for pre meds

## 2013-06-10 ENCOUNTER — Other Ambulatory Visit (HOSPITAL_COMMUNITY): Payer: Self-pay | Admitting: *Deleted

## 2013-06-11 ENCOUNTER — Encounter (HOSPITAL_COMMUNITY)
Admission: RE | Admit: 2013-06-11 | Discharge: 2013-06-11 | Disposition: A | Discharge status: Home / Self Care | Payer: BC Managed Care – PPO | Source: Ambulatory Visit | Attending: Gastroenterology | Admitting: Gastroenterology

## 2013-06-11 ENCOUNTER — Encounter (HOSPITAL_COMMUNITY): Payer: Self-pay

## 2013-06-11 DIAGNOSIS — K5100 Ulcerative (chronic) pancolitis without complications: Secondary | ICD-10-CM | POA: Insufficient documentation

## 2013-06-11 MED ORDER — SODIUM CHLORIDE 0.9 % IV SOLN
700.0000 mg | INTRAVENOUS | Status: AC
  Administered 2013-06-11: 700 mg via INTRAVENOUS
  Filled 2013-06-11: qty 70

## 2013-06-11 MED ORDER — DIPHENHYDRAMINE HCL 25 MG PO TABS
50.0000 mg | ORAL_TABLET | Freq: Once | ORAL | Status: AC
  Administered 2013-06-11: 50 mg via ORAL
  Filled 2013-06-11: qty 2

## 2013-06-11 MED ORDER — SODIUM CHLORIDE 0.9 % IV SOLN
INTRAVENOUS | Status: AC
  Administered 2013-06-11: 10:00:00 via INTRAVENOUS

## 2013-06-11 MED ORDER — ACETAMINOPHEN 500 MG PO TABS
ORAL_TABLET | ORAL | Status: AC
  Filled 2013-06-11: qty 2

## 2013-06-11 MED ORDER — DIPHENHYDRAMINE HCL 25 MG PO CAPS
ORAL_CAPSULE | ORAL | Status: AC
  Filled 2013-06-11: qty 2

## 2013-06-11 MED ORDER — ACETAMINOPHEN 500 MG PO TABS
1000.0000 mg | ORAL_TABLET | ORAL | Status: AC
  Administered 2013-06-11: 1000 mg via ORAL

## 2013-07-02 ENCOUNTER — Emergency Department (HOSPITAL_COMMUNITY)
Admission: EM | Admit: 2013-07-02 | Discharge: 2013-07-02 | Disposition: A | Discharge status: Home / Self Care | Payer: BC Managed Care – PPO | Attending: Emergency Medicine | Admitting: Emergency Medicine

## 2013-07-02 ENCOUNTER — Encounter (HOSPITAL_COMMUNITY): Payer: Self-pay | Admitting: Emergency Medicine

## 2013-07-02 ENCOUNTER — Emergency Department (HOSPITAL_COMMUNITY): Payer: BC Managed Care – PPO

## 2013-07-02 DIAGNOSIS — R5381 Other malaise: Secondary | ICD-10-CM | POA: Insufficient documentation

## 2013-07-02 DIAGNOSIS — R110 Nausea: Secondary | ICD-10-CM | POA: Insufficient documentation

## 2013-07-02 DIAGNOSIS — Z79899 Other long term (current) drug therapy: Secondary | ICD-10-CM | POA: Insufficient documentation

## 2013-07-02 DIAGNOSIS — J45909 Unspecified asthma, uncomplicated: Secondary | ICD-10-CM | POA: Insufficient documentation

## 2013-07-02 DIAGNOSIS — IMO0001 Reserved for inherently not codable concepts without codable children: Secondary | ICD-10-CM | POA: Insufficient documentation

## 2013-07-02 DIAGNOSIS — R42 Dizziness and giddiness: Secondary | ICD-10-CM | POA: Insufficient documentation

## 2013-07-02 DIAGNOSIS — IMO0002 Reserved for concepts with insufficient information to code with codable children: Secondary | ICD-10-CM | POA: Insufficient documentation

## 2013-07-02 DIAGNOSIS — K5190 Ulcerative colitis, unspecified, without complications: Secondary | ICD-10-CM | POA: Insufficient documentation

## 2013-07-02 LAB — CBC WITH DIFFERENTIAL/PLATELET
Basophils Relative: 0 % (ref 0–1)
HCT: 43.7 % (ref 39.0–52.0)
Hemoglobin: 15.6 g/dL (ref 13.0–17.0)
Lymphocytes Relative: 26 % (ref 12–46)
Lymphs Abs: 1.6 10*3/uL (ref 0.7–4.0)
MCHC: 35.7 g/dL (ref 30.0–36.0)
Monocytes Relative: 9 % (ref 3–12)
Neutro Abs: 3.7 10*3/uL (ref 1.7–7.7)
Neutrophils Relative %: 58 % (ref 43–77)
RBC: 4.94 MIL/uL (ref 4.22–5.81)
WBC: 6.4 10*3/uL (ref 4.0–10.5)

## 2013-07-02 LAB — COMPREHENSIVE METABOLIC PANEL
ALT: 26 U/L (ref 0–53)
AST: 43 U/L — ABNORMAL HIGH (ref 0–37)
CO2: 27 mEq/L (ref 19–32)
Calcium: 9.2 mg/dL (ref 8.4–10.5)
Chloride: 104 mEq/L (ref 96–112)
Creatinine, Ser: 1 mg/dL (ref 0.50–1.35)
GFR calc Af Amer: >90 mL/min (ref >90)
Potassium: 3.7 mEq/L (ref 3.5–5.1)
Total Protein: 7.9 g/dL (ref 6.0–8.3)

## 2013-07-02 LAB — URINALYSIS, ROUTINE W REFLEX MICROSCOPIC
Bilirubin Urine: NEGATIVE
Nitrite: NEGATIVE
Specific Gravity, Urine: 1.022 (ref 1.005–1.030)
pH: 6 (ref 5.0–8.0)

## 2013-07-02 LAB — LIPASE, BLOOD: Lipase: 43 U/L (ref 11–59)

## 2013-07-02 LAB — CG4 I-STAT (LACTIC ACID): Lactic Acid, Venous: 1.26 mmol/L (ref 0.5–2.2)

## 2013-07-02 MED ORDER — ONDANSETRON HCL 4 MG PO TABS: 4.0000 mg | ORAL_TABLET | Freq: Four times a day (QID) | ORAL | Status: DC | PRN

## 2013-07-02 MED ORDER — SODIUM CHLORIDE 0.9 % IV BOLUS (SEPSIS)
1000.0000 mL | Freq: Once | INTRAVENOUS | Status: AC
  Administered 2013-07-02: 1000 mL via INTRAVENOUS

## 2013-07-02 MED ORDER — IOHEXOL 300 MG/ML  SOLN
100.0000 mL | Freq: Once | INTRAMUSCULAR | Status: AC | PRN
  Administered 2013-07-02: 100 mL via INTRAVENOUS

## 2013-07-02 MED ORDER — IOHEXOL 300 MG/ML  SOLN
50.0000 mL | Freq: Once | INTRAMUSCULAR | Status: AC | PRN
  Administered 2013-07-02: 50 mL via ORAL

## 2013-07-02 MED ORDER — ONDANSETRON HCL 4 MG/2ML IJ SOLN
4.0000 mg | Freq: Once | INTRAMUSCULAR | Status: AC
  Administered 2013-07-02: 4 mg via INTRAVENOUS
  Filled 2013-07-02: qty 2

## 2013-07-02 MED ORDER — PREDNISONE 20 MG PO TABS
20.0000 mg | ORAL_TABLET | Freq: Once | ORAL | Status: AC
  Administered 2013-07-02: 20 mg via ORAL
  Filled 2013-07-02: qty 1

## 2013-07-02 MED ORDER — PREDNISONE 20 MG PO TABS: 20.0000 mg | ORAL_TABLET | Freq: Every day | ORAL | Status: DC

## 2013-07-02 NOTE — ED Notes (Addendum)
Pt presents today with complaint of intermittent dizziness, which is worse at night. Pt also reports seeing blood on his tissue after having a bowel movement. Pt reports generalized lower abdominal pain, nausea, and diarrhea over the past week, however denies emesis.

## 2013-07-02 NOTE — ED Notes (Signed)
Patient transported to CT 

## 2013-07-02 NOTE — ED Provider Notes (Signed)
CSN: [id number]     Arrival date & time 07/02/13  1806 History   First MD Initiated Contact with Patient 07/02/13 1856     Chief Complaint  Patient presents with  . Dizziness  . Rectal Bleeding   (Consider location/radiation/quality/duration/timing/severity/associated sxs/prior Treatment) HPI Comments: 52 year old male with a one-year history of ulcerative colitis presents with 2-4 weeks of lightheadedness, nausea, lower abdominal pain and diffuse muscle aches. He states this is similar to the time he presented last year when he was diagnosed with ulcerative colitis. He denies any headaches, chest pain, shortness of breath, or focal weakness. The lightheadedness is worse when he stands up. He does also improve when he rests. Over the past 3-4 days he is noticed bright red blood on his tissue paper when he wipes. He has not noticed any blood in his stool. He's also been having lower abdominal pain.  Patient is a 52 y.o. male presenting with hematochezia. The history is provided by the patient.  Rectal Bleeding Associated symptoms: abdominal pain and light-headedness   Associated symptoms: no fever and no vomiting     Past Medical History  Diagnosis Date  . Colitis, ulcerative chronic   . Asthma    Past Surgical History  Procedure Laterality Date  . Hernia repair     No family history on file. History  Substance Use Topics  . Smoking status: Never Smoker   . Smokeless tobacco: Never Used  . Alcohol Use: 1.2 oz/week    2 Cans of beer per week    Review of Systems  Constitutional: Negative for fever and chills.  Respiratory: Negative for shortness of breath.   Cardiovascular: Negative for chest pain.  Gastrointestinal: Positive for nausea, abdominal pain, diarrhea and hematochezia. Negative for vomiting, constipation and blood in stool.  Genitourinary: Negative for dysuria.  Musculoskeletal: Negative for back pain.       Diffuse muscle aches  Neurological: Positive for weakness  (Generalized) and light-headedness.  All other systems reviewed and are negative.    Allergies  Review of patient's allergies indicates no known allergies.  Home Medications   Current Outpatient Rx  Name  Route  Sig  Dispense  Refill  . glucosamine-chondroitin 500-400 MG tablet   Oral   Take 1 tablet by mouth 3 (three) times daily.         . mesalamine (APRISO) 0.375 G 24 hr capsule   Oral   Take 1,500 mg by mouth daily.          . predniSONE (DELTASONE) 5 MG tablet   Oral   Take 5 mg by mouth every other day.          BP 179/100  Pulse 76  Temp(Src) 98.7 F (37.1 C) (Oral)  Resp 18  Ht 5\' 11"  (1.803 m)  Wt 260 lb (117.935 kg)  BMI 36.28 kg/m2  SpO2 96% Physical Exam  Nursing note and vitals reviewed. Constitutional: He is oriented to person, place, and time. He appears well-developed and well-nourished.  HENT:  Head: Normocephalic and atraumatic.  Right Ear: External ear normal.  Left Ear: External ear normal.  Nose: Nose normal.  Eyes: EOM are normal. Pupils are equal, round, and reactive to light. Right eye exhibits no discharge. Left eye exhibits no discharge.  Neck: Neck supple.  Cardiovascular: Normal rate, regular rhythm, normal heart sounds and intact distal pulses.   Pulmonary/Chest: Effort normal and breath sounds normal.  Abdominal: Soft. There is generalized tenderness (Worst in lower abdomen). A hernia  is present. Hernia confirmed positive in the ventral area (easily reducible).  Musculoskeletal: He exhibits no edema.  Neurological: He is alert and oriented to person, place, and time. He has normal strength. No cranial nerve deficit or sensory deficit. He exhibits normal muscle tone. GCS eye subscore is 4. GCS verbal subscore is 5. GCS motor subscore is 6.  Skin: Skin is warm and dry.    ED Course  Procedures (including critical care time) Labs Review Labs Reviewed  CBC WITH DIFFERENTIAL - Abnormal; Notable for the following:    Platelets  143 (*)    Eosinophils Relative 7 (*)    All other components within normal limits  COMPREHENSIVE METABOLIC PANEL - Abnormal; Notable for the following:    AST 43 (*)    GFR calc non Af Amer 84 (*)    All other components within normal limits  OCCULT BLOOD, POC DEVICE - Abnormal; Notable for the following:    Fecal Occult Bld POSITIVE (*)    All other components within normal limits  URINALYSIS, ROUTINE W REFLEX MICROSCOPIC  LIPASE, BLOOD  CG4 I-STAT (LACTIC ACID)    Date: 07/02/2013  Rate: 79  Rhythm: normal sinus rhythm  QRS Axis: normal  Intervals: normal  ST/T Wave abnormalities: normal  Conduction Disutrbances:none  Narrative Interpretation:   Old EKG Reviewed: unchanged   Imaging Review Ct Abdomen Pelvis W Contrast  07/02/2013   CLINICAL DATA:  Lower pelvic pain. History of colitis.  EXAM: CT ABDOMEN AND PELVIS WITH CONTRAST  TECHNIQUE: Multidetector CT imaging of the abdomen and pelvis was performed using the standard protocol following bolus administration of intravenous contrast.  CONTRAST:  OMNIPAQUE IOHEXOL 300 MG/ML  SOLN  COMPARISON:  12/29/2012  FINDINGS: Lung bases are clear. No effusions. Heart is normal size.  Nodular contours throughout the liver compatible with cirrhosis, stable. Numerous renal cysts are again noted compatible with polycystic kidney disease. Punctate nonobstructing right renal stones. Mild splenomegaly with a craniocaudal length of 14.2 cm. 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 Gallbladder, stomach, pancreas, adrenals are unremarkable.  Appendix is visualized and is normal. Large and small bowel grossly unremarkable. No free fluid, free air or adenopathy.  No acute bony abnormality.  IMPRESSION: Changes of cirrhosis with associated splenomegaly and portal venous hypertension.  Polycystic kidneys, stable.  No acute findings.   Electronically Signed   By: Charlett Nose M.D.   On: 07/02/2013 22:05    MDM   1.  Ulcerative colitis, acute    The patient feels improved after fluids and antiemetics. His abdomen has mild tenderness but no signs of surgical abdomen. CT scan shows no acute changes. I discussed with gastroenterology on call (Dr. Randa Evens), who is familiar with the patient. As this appears to be a milder form of acute ulcerative colitis we will bump up his prednisone to 20 mg per day and discharged with Zofran. Patient feels well to go home. He appears well-hydrated at time of discharge. I discussed return precautions with the patient and he will call his gastroenterologist on Monday (2 days from now).     Audree Camel, MD 07/02/13 [phone]

## 2013-07-06 ENCOUNTER — Other Ambulatory Visit: Payer: Self-pay | Admitting: Gastroenterology

## 2013-07-06 DIAGNOSIS — R110 Nausea: Secondary | ICD-10-CM

## 2013-07-07 ENCOUNTER — Other Ambulatory Visit: Payer: Self-pay | Admitting: Gastroenterology

## 2013-07-09 ENCOUNTER — Ambulatory Visit
Admission: RE | Admit: 2013-07-09 | Discharge: 2013-07-09 | Disposition: A | Discharge status: Home / Self Care | Payer: BC Managed Care – PPO | Source: Ambulatory Visit | Attending: Gastroenterology | Admitting: Gastroenterology

## 2013-07-09 DIAGNOSIS — R110 Nausea: Secondary | ICD-10-CM

## 2013-08-05 ENCOUNTER — Other Ambulatory Visit (HOSPITAL_COMMUNITY): Payer: Self-pay | Admitting: *Deleted

## 2013-08-06 ENCOUNTER — Encounter (HOSPITAL_COMMUNITY)
Admission: RE | Admit: 2013-08-06 | Discharge: 2013-08-06 | Disposition: A | Discharge status: Home / Self Care | Payer: BC Managed Care – PPO | Source: Ambulatory Visit | Attending: Gastroenterology | Admitting: Gastroenterology

## 2013-08-06 DIAGNOSIS — K5100 Ulcerative (chronic) pancolitis without complications: Secondary | ICD-10-CM | POA: Insufficient documentation

## 2013-08-06 MED ORDER — ACETAMINOPHEN 500 MG PO TABS
ORAL_TABLET | ORAL | Status: AC
  Filled 2013-08-06: qty 2

## 2013-08-06 MED ORDER — DIPHENHYDRAMINE HCL 25 MG PO CAPS
ORAL_CAPSULE | ORAL | Status: AC
  Filled 2013-08-06: qty 2

## 2013-08-06 MED ORDER — SODIUM CHLORIDE 0.9 % IV SOLN
800.0000 mg | INTRAVENOUS | Status: DC
  Administered 2013-08-06: 800 mg via INTRAVENOUS
  Filled 2013-08-06: qty 80

## 2013-08-06 MED ORDER — ACETAMINOPHEN 325 MG PO TABS: 650.0000 mg | ORAL_TABLET | ORAL | Status: DC

## 2013-08-06 MED ORDER — ACETAMINOPHEN 500 MG PO TABS
1000.0000 mg | ORAL_TABLET | ORAL | Status: AC
  Administered 2013-08-06: 1000 mg via ORAL

## 2013-08-06 MED ORDER — DIPHENHYDRAMINE HCL 25 MG PO TABS
50.0000 mg | ORAL_TABLET | ORAL | Status: DC
  Administered 2013-08-06: 10:00:00 50 mg via ORAL
  Filled 2013-08-06: qty 2

## 2013-08-06 MED ORDER — SODIUM CHLORIDE 0.9 % IV SOLN
INTRAVENOUS | Status: DC
  Administered 2013-08-06: 250 mL via INTRAVENOUS

## 2013-10-01 ENCOUNTER — Encounter (HOSPITAL_COMMUNITY)
Admission: RE | Admit: 2013-10-01 | Discharge: 2013-10-01 | Disposition: A | Discharge status: Home / Self Care | Payer: BC Managed Care – PPO | Source: Ambulatory Visit | Attending: Gastroenterology | Admitting: Gastroenterology

## 2013-10-01 DIAGNOSIS — K5100 Ulcerative (chronic) pancolitis without complications: Secondary | ICD-10-CM | POA: Insufficient documentation

## 2013-10-01 MED ORDER — DIPHENHYDRAMINE HCL 25 MG PO CAPS
ORAL_CAPSULE | ORAL | Status: AC
  Administered 2013-10-01: 50 mg via ORAL
  Filled 2013-10-01: qty 2

## 2013-10-01 MED ORDER — SODIUM CHLORIDE 0.9 % IV SOLN
800.0000 mg | INTRAVENOUS | Status: AC
  Administered 2013-10-01: 10:00:00 800 mg via INTRAVENOUS
  Filled 2013-10-01: qty 80

## 2013-10-01 MED ORDER — SODIUM CHLORIDE 0.9 % IV SOLN
INTRAVENOUS | Status: AC
  Administered 2013-10-01: 09:00:00 via INTRAVENOUS

## 2013-10-01 MED ORDER — DIPHENHYDRAMINE HCL 25 MG PO TABS
50.0000 mg | ORAL_TABLET | ORAL | Status: AC
  Administered 2013-10-01: 50 mg via ORAL

## 2013-10-01 MED ORDER — ACETAMINOPHEN 500 MG PO TABS
1000.0000 mg | ORAL_TABLET | ORAL | Status: AC
  Administered 2013-10-01: 1000 mg via ORAL

## 2013-10-01 MED ORDER — ACETAMINOPHEN 500 MG PO TABS
ORAL_TABLET | ORAL | Status: AC
  Administered 2013-10-01: 1000 mg via ORAL
  Filled 2013-10-01: qty 2

## 2013-11-26 ENCOUNTER — Encounter (HOSPITAL_COMMUNITY)
Admission: RE | Admit: 2013-11-26 | Discharge: 2013-11-26 | Disposition: A | Discharge status: Home / Self Care | Payer: BC Managed Care – PPO | Source: Ambulatory Visit | Attending: Gastroenterology | Admitting: Gastroenterology

## 2013-11-26 DIAGNOSIS — K5100 Ulcerative (chronic) pancolitis without complications: Secondary | ICD-10-CM | POA: Insufficient documentation

## 2013-11-26 MED ORDER — DIPHENHYDRAMINE HCL 25 MG PO CAPS
ORAL_CAPSULE | ORAL | Status: AC
  Filled 2013-11-26: qty 1

## 2013-11-26 MED ORDER — DIPHENHYDRAMINE HCL 25 MG PO CAPS
ORAL_CAPSULE | ORAL | Status: AC
  Filled 2013-11-26: qty 2

## 2013-11-26 MED ORDER — ACETAMINOPHEN 500 MG PO TABS
ORAL_TABLET | ORAL | Status: AC
  Filled 2013-11-26: qty 1

## 2013-11-26 MED ORDER — ACETAMINOPHEN 500 MG PO TABS: 500.0000 mg | ORAL_TABLET | Freq: Once | ORAL | Status: DC

## 2013-11-26 MED ORDER — DIPHENHYDRAMINE HCL 25 MG PO TABS
50.0000 mg | ORAL_TABLET | Freq: Once | ORAL | Status: AC
  Administered 2013-11-26: 50 mg via ORAL
  Filled 2013-11-26: qty 2

## 2013-11-26 MED ORDER — SODIUM CHLORIDE 0.9 % IV SOLN
INTRAVENOUS | Status: DC
  Administered 2013-11-26: 09:00:00 via INTRAVENOUS

## 2013-11-26 MED ORDER — ACETAMINOPHEN 500 MG PO TABS
1000.0000 mg | ORAL_TABLET | Freq: Once | ORAL | Status: AC
  Administered 2013-11-26: 09:00:00 1000 mg via ORAL

## 2013-11-26 MED ORDER — SODIUM CHLORIDE 0.9 % IV SOLN
700.0000 mg | INTRAVENOUS | Status: DC
  Administered 2013-11-26: 700 mg via INTRAVENOUS
  Filled 2013-11-26: qty 70

## 2014-01-12 ENCOUNTER — Other Ambulatory Visit: Payer: Self-pay | Admitting: Gastroenterology

## 2014-01-14 ENCOUNTER — Emergency Department (HOSPITAL_COMMUNITY): Payer: BC Managed Care – PPO

## 2014-01-14 ENCOUNTER — Encounter (HOSPITAL_COMMUNITY)
Admission: EM | Disposition: A | Discharge status: Home / Self Care | Payer: Self-pay | Source: Home / Self Care | Attending: Emergency Medicine

## 2014-01-14 ENCOUNTER — Encounter (HOSPITAL_COMMUNITY): Payer: Self-pay | Admitting: Emergency Medicine

## 2014-01-14 ENCOUNTER — Emergency Department (HOSPITAL_COMMUNITY)
Admission: EM | Admit: 2014-01-14 | Discharge: 2014-01-14 | Disposition: A | Discharge status: Home / Self Care | Payer: BC Managed Care – PPO | Attending: Emergency Medicine | Admitting: Emergency Medicine

## 2014-01-14 DIAGNOSIS — Y849 Medical procedure, unspecified as the cause of abnormal reaction of the patient, or of later complication, without mention of misadventure at the time of the procedure: Secondary | ICD-10-CM | POA: Insufficient documentation

## 2014-01-14 DIAGNOSIS — IMO0002 Reserved for concepts with insufficient information to code with codable children: Secondary | ICD-10-CM | POA: Insufficient documentation

## 2014-01-14 DIAGNOSIS — K922 Gastrointestinal hemorrhage, unspecified: Secondary | ICD-10-CM

## 2014-01-14 DIAGNOSIS — Z860100 Personal history of colon polyps, unspecified: Secondary | ICD-10-CM | POA: Insufficient documentation

## 2014-01-14 DIAGNOSIS — K5190 Ulcerative colitis, unspecified, without complications: Secondary | ICD-10-CM | POA: Insufficient documentation

## 2014-01-14 DIAGNOSIS — J45909 Unspecified asthma, uncomplicated: Secondary | ICD-10-CM | POA: Insufficient documentation

## 2014-01-14 HISTORY — PX: FLEXIBLE SIGMOIDOSCOPY: SHX5431

## 2014-01-14 LAB — URINALYSIS, ROUTINE W REFLEX MICROSCOPIC
BILIRUBIN URINE: NEGATIVE
Glucose, UA: NEGATIVE mg/dL
Hgb urine dipstick: NEGATIVE
KETONES UR: NEGATIVE mg/dL
LEUKOCYTES UA: NEGATIVE
NITRITE: NEGATIVE
PH: 5 (ref 5.0–8.0)
Protein, ur: NEGATIVE mg/dL
SPECIFIC GRAVITY, URINE: 1.022 (ref 1.005–1.030)
Urobilinogen, UA: 0.2 mg/dL (ref 0.0–1.0)

## 2014-01-14 LAB — CBC WITH DIFFERENTIAL/PLATELET
Basophils Absolute: 0 10*3/uL (ref 0.0–0.1)
Basophils Relative: 0 % (ref 0–1)
EOS ABS: 0.2 10*3/uL (ref 0.0–0.7)
Eosinophils Relative: 3 % (ref 0–5)
HEMATOCRIT: 43.1 % (ref 39.0–52.0)
Hemoglobin: 15.3 g/dL (ref 13.0–17.0)
LYMPHS ABS: 1.4 10*3/uL (ref 0.7–4.0)
LYMPHS PCT: 27 % (ref 12–46)
MCH: 31.6 pg (ref 26.0–34.0)
MCHC: 35.5 g/dL (ref 30.0–36.0)
MCV: 89 fL (ref 78.0–100.0)
MONO ABS: 0.5 10*3/uL (ref 0.1–1.0)
MONOS PCT: 9 % (ref 3–12)
Neutro Abs: 3 10*3/uL (ref 1.7–7.7)
Neutrophils Relative %: 60 % (ref 43–77)
Platelets: 173 10*3/uL (ref 150–400)
RBC: 4.84 MIL/uL (ref 4.22–5.81)
RDW: 12.6 % (ref 11.5–15.5)
WBC: 5 10*3/uL (ref 4.0–10.5)

## 2014-01-14 LAB — COMPREHENSIVE METABOLIC PANEL
ALT: 18 U/L (ref 0–53)
AST: 26 U/L (ref 0–37)
Albumin: 3.4 g/dL — ABNORMAL LOW (ref 3.5–5.2)
Alkaline Phosphatase: 57 U/L (ref 39–117)
BUN: 14 mg/dL (ref 6–23)
CALCIUM: 9.4 mg/dL (ref 8.4–10.5)
CO2: 23 meq/L (ref 19–32)
Chloride: 106 mEq/L (ref 96–112)
Creatinine, Ser: 0.94 mg/dL (ref 0.50–1.35)
GFR calc Af Amer: >90 mL/min (ref >90)
GFR calc non Af Amer: >90 mL/min (ref >90)
Glucose, Bld: 79 mg/dL (ref 70–99)
Potassium: 4.2 mEq/L (ref 3.7–5.3)
Sodium: 139 mEq/L (ref 137–147)
TOTAL PROTEIN: 7.7 g/dL (ref 6.0–8.3)
Total Bilirubin: 0.4 mg/dL (ref 0.3–1.2)

## 2014-01-14 SURGERY — SIGMOIDOSCOPY, FLEXIBLE
Anesthesia: Moderate Sedation

## 2014-01-14 MED ORDER — MIDAZOLAM HCL 10 MG/2ML IJ SOLN
INTRAMUSCULAR | Status: DC | PRN
  Administered 2014-01-14 (×5): 2 mg via INTRAVENOUS

## 2014-01-14 MED ORDER — FENTANYL CITRATE 0.05 MG/ML IJ SOLN
INTRAMUSCULAR | Status: AC
  Filled 2014-01-14: qty 2

## 2014-01-14 MED ORDER — MIDAZOLAM HCL 10 MG/2ML IJ SOLN
INTRAMUSCULAR | Status: AC
  Filled 2014-01-14: qty 2

## 2014-01-14 MED ORDER — SODIUM CHLORIDE 0.9 % IJ SOLN
INTRAMUSCULAR | Status: DC | PRN
  Administered 2014-01-14: 16:00:00

## 2014-01-14 MED ORDER — FENTANYL CITRATE 0.05 MG/ML IJ SOLN
INTRAMUSCULAR | Status: DC | PRN
  Administered 2014-01-14 (×4): 25 ug via INTRAVENOUS

## 2014-01-14 MED ORDER — SODIUM CHLORIDE 0.9 % IV SOLN
INTRAVENOUS | Status: DC
  Administered 2014-01-14: 16:00:00 via INTRAVENOUS

## 2014-01-14 MED ORDER — SODIUM CHLORIDE 0.9 % IV BOLUS (SEPSIS)
1000.0000 mL | Freq: Once | INTRAVENOUS | Status: AC
  Administered 2014-01-14: 1000 mL via INTRAVENOUS

## 2014-01-14 MED ORDER — SODIUM CHLORIDE 0.9 % IV SOLN: INTRAVENOUS | Status: DC

## 2014-01-14 NOTE — ED Notes (Signed)
Bed: WA16 Expected date:  Expected time:  Means of arrival:  Comments: Triage 1 

## 2014-01-14 NOTE — ED Notes (Signed)
Bed: [id number] Expected date:  Expected time:  Means of arrival:  Comments: Pt in endoscopy

## 2014-01-14 NOTE — H&P (Signed)
Subjective:   HPI  The patient is a 53 year old male with a history of ulcerative colitis. He underwent a colonoscopy a couple of days ago because of rectal bleeding and had random biopsies obtained of the colon which did show some inflammation. There was a 1-1-1/2 cm polyp according to Dr. Randa EvensEdwards in the left colon around the junction of the sigmoid and descending colon which was sessile and removed. A clip was put on this site. The patient went home but this morning about 3:00 started to experience rectal bleeding. He went to work but after her continued episodes of rectal bleeding call the office and was told to come to the emergency room. He has been hemodynamically stable and otherwise feels fine.  Review of Systems He denies chest pain or shortness of breath.  Past Medical History  Diagnosis Date  . Colitis, ulcerative chronic   . Asthma    Past Surgical History  Procedure Laterality Date  . Hernia repair     History   Social History  . Marital Status: Married    Spouse Name: N/A    Number of Children: N/A  . Years of Education: N/A   Occupational History  . Not on file.   Social History Main Topics  . Smoking status: Never Smoker   . Smokeless tobacco: Never Used  . Alcohol Use: 1.2 oz/week    2 Cans of beer per week     Comment: Rarely  . Drug Use: No  . Sexual Activity: Not on file   Other Topics Concern  . Not on file   Social History Narrative  . No narrative on file   family history is not on file. No current facility-administered medications for this encounter. Current outpatient prescriptions:acetaminophen (TYLENOL) 500 MG tablet, Take 1,000 mg by mouth 2 (two) times daily., Disp: , Rfl: ;  albuterol (PROVENTIL HFA;VENTOLIN HFA) 108 (90 BASE) MCG/ACT inhaler, Inhale 2 puffs into the lungs every 6 (six) hours as needed for wheezing or shortness of breath., Disp: , Rfl: ;  mesalamine (APRISO) 0.375 G 24 hr capsule, Take 1,500 mg by mouth daily. , Disp: , Rfl:   predniSONE (DELTASONE) 5 MG tablet, Take 5 mg by mouth daily. , Disp: , Rfl:  No Known Allergies   Objective:     BP 167/94  Pulse 68  Temp(Src) 98.4 F (36.9 C) (Oral)  Resp 10  Ht 5\' 11"  (1.803 m)  Wt 112.492 kg (248 lb)  BMI 34.60 kg/m2  SpO2 98%  He does not appear in any acute distress  Nonicteric  Heart regular rhythm no murmurs  Lungs clear  Abdomen: Bowel sounds normal, soft, nontender  Laboratory No components found with this basename: d1      Assessment:     #1. Ulcerative colitis  #2. Status post colonoscopy a couple days ago with random biopsies and polypectomy of a polyp in the left colon  #3. Rectal bleeding post polypectomy      Plan:     The patient appears stable from a clinical standpoint and is in no acute distress however continues to experience rectal bleeding. It is suspected that this bleeding is from the polypectomy site. We have elected to bring him up to the endoscopy unit directly from the emergency room to proceed with flexible sigmoidoscopy/colonoscopy to see if we can localize the site of bleeding and control it. Lab Results  Component Value Date   HGB 15.3 01/14/2014   HGB 15.6 07/02/2013   HGB 16.8  12/29/2012   HCT 43.1 01/14/2014   HCT 43.7 07/02/2013   HCT 46.3 12/29/2012   ALKPHOS 57 01/14/2014   ALKPHOS 52 07/02/2013   ALKPHOS 44 06/19/2011   AST 26 01/14/2014   AST 43* 07/02/2013   AST 27 06/19/2011   ALT 18 01/14/2014   ALT 26 07/02/2013   ALT 15 06/19/2011

## 2014-01-14 NOTE — ED Notes (Addendum)
Per pt, states he had colonoscopy on Wed- had polyp removed- went to bathroom this am and had dark blood with BM-happened tow times after-lightheaded-

## 2014-01-14 NOTE — Op Note (Signed)
[facility] [address] [place], [id number]   Colonoscopy with control of bleeding  PATIENT: [name], Allen E.  MR#: [id number] BIRTHDATE: 02/11/1961 , 52  yrs. old GENDER: Male ENDOSCOPIST: Wandalee FerdinandSam Vitoria Conyer, MD REFERRED BY: Randa EvensEdwards PROCEDURE DATE:  01/14/2014 PROCEDURE:   colonoscopy with epinephrine injection ASA CLASS:   2 INDICATIONS:the patient is a 53 year old male with a history of ulcerative colitis. He underwent colonoscopy a couple days ago with random biopsies and there was a 1-1-1/2 cm sessile polyp removed from the left colon. He began having multiple episodes of rectal bleeding and came to the emergency room. He was brought to the endoscopy unit for further evaluation MEDICATIONS: fentanyl 100 mcg IV, Versed 10 mg IV  DESCRIPTION OF PROCEDURE:   After the risks benefits and alternatives of the procedure were thoroughly explained, informed consent was obtained.  digital exam was normal      The Pentax colonoscope       endoscope was introduced through the anus  and advanced to the cecum      , limited by No adverse events experienced.   The quality of the prep was, this was unprepped .  The instrument was then slowly withdrawn as the mucosa was fully examined.  The scope was advanced into the rectum and advanced up to the left colon I did not see any frank bleeding or balloon us amounts of blood in the colon. There was a fair amount of yellowish-green stool. I could not identify a site of bleeding upon initial passage of the scope and therefore continue to advance the scope easily to the cecum. There was no gross blood seen to any degree around the colon. The scope was brought back visualizing the mucosa from cecum to rectum which did reveal evidence of scattered ulcerations as well as erythematous mucosa compatible with his known diagnosis of colitis. Upon withdrawal of the scope I finally saw the area of the polypectomy site in the left colon which  appeared to be up in the descending colon area. There was a clip on this site as seen in the photographs below. Upon observation of this site I could see that on the left periphery of the polypectomy site there was a flat reddish area that oozed blood. This area was washed and then would start to ooze blood again. In view of this went ahead and injected 2 cc of epinephrine at this site. This stopped all the oozing of blood.       The scope was then withdrawn from the patient and the procedure terminated.  COMPLICATIONS: There were no complications.  ENDOSCOPIC IMPRESSION:ulcerative colitis. Post polypectomy site noted in the descending colon with a clip on it and a focal site of bleeding noted. This area was injected with epinephrine and the bleeding stopped.  RECOMMENDATIONS:return to emergency room. Okay from GI standpoint for discharge. Standard outpatient endoscopy instructions. Call if further problems.   REPEAT EXAM:   _______________________________ Rhodia AlbrighteSigned:  Sam Neely Kammerer, MD 01/14/2014 4:26 PM   CC:  PATIENT NAME:  [name], Allen E. MR#: [id number]

## 2014-01-14 NOTE — ED Provider Notes (Addendum)
CSN: [id number]     Arrival date & time 01/14/14  1156 History   First MD Initiated Contact with Patient 01/14/14 1247     Chief Complaint  Patient presents with  . Rectal Bleeding      HPI  Patient presents with concern rectal bleeding. Patient had colonoscopy 2 days ago, including polyp resection.  Initially patient did not have rectal bleeding, but over the past days he has had greater than 6 episodes of red blood with stool. There is minor associated lower abdominal discomfort, described as sore, diffuse, minor.  There is no concurrent syncope, near syncope, chest pain. Patient denies notable medical problems beyond ulcerative colitis.   Past Medical History  Diagnosis Date  . Colitis, ulcerative chronic   . Asthma    Past Surgical History  Procedure Laterality Date  . Hernia repair     No family history on file. History  Substance Use Topics  . Smoking status: Never Smoker   . Smokeless tobacco: Never Used  . Alcohol Use: 1.2 oz/week    2 Cans of beer per week    Review of Systems  Constitutional:       Per HPI, otherwise negative  HENT:       Per HPI, otherwise negative  Respiratory:       Per HPI, otherwise negative  Cardiovascular:       Per HPI, otherwise negative  Gastrointestinal: Positive for abdominal pain and blood in stool. Negative for nausea and vomiting.  Endocrine:       Negative aside from HPI  Genitourinary:       Neg aside from HPI   Musculoskeletal:       Per HPI, otherwise negative  Skin: Negative.   Neurological: Negative for syncope.      Allergies  Review of patient's allergies indicates no known allergies.  Home Medications   Current Outpatient Rx  Name  Route  Sig  Dispense  Refill  . acetaminophen (TYLENOL) 500 MG tablet   Oral   Take 1,000 mg by mouth 2 (two) times daily.         Marland Kitchen albuterol (PROVENTIL HFA;VENTOLIN HFA) 108 (90 BASE) MCG/ACT inhaler   Inhalation   Inhale 2 puffs into the lungs every 6 (six) hours  as needed for wheezing or shortness of breath.         . mesalamine (APRISO) 0.375 G 24 hr capsule   Oral   Take 1,500 mg by mouth daily.          . predniSONE (DELTASONE) 5 MG tablet   Oral   Take 5 mg by mouth daily.           BP 169/98  Pulse 74  Temp(Src) 97.9 F (36.6 C) (Oral)  Resp 18  SpO2 98% Physical Exam  Nursing note and vitals reviewed. Constitutional: He is oriented to person, place, and time. He appears well-developed. No distress.  HENT:  Head: Normocephalic and atraumatic.  Eyes: Conjunctivae and EOM are normal.  Cardiovascular: Normal rate and regular rhythm.   Pulmonary/Chest: Effort normal. No stridor. No respiratory distress.  Abdominal: He exhibits no distension.  Minimal discomfort about the lower abdomen with pressure, no rebound, no guarding  Musculoskeletal: He exhibits no edema.  Neurological: He is alert and oriented to person, place, and time.  Skin: Skin is warm and dry.  Psychiatric: He has a normal mood and affect.    ED Course  Procedures (including critical care time) Labs Review Labs  Reviewed  COMPREHENSIVE METABOLIC PANEL - Abnormal; Notable for the following:    Albumin 3.4 (*)    All other components within normal limits  CBC WITH DIFFERENTIAL  URINALYSIS, ROUTINE W REFLEX MICROSCOPIC    2:05 PM On exam the patient appears calm.  Vital signs are main stable.  Reviewed all findings with him and his wife. Acids as the patient's case with our gastroenterology team. Patient had one more episode of bloody stool here.  She will be taken to the endoscopy suite for further evaluation and management.  MDM  Patient presents with concerns of rectal bleeding.  On exam he is awake, alert and hemodynamically stable, but he continues to have ongoing rectal bleeding. Given the patient's recent colonoscopy, and polyp removal, there is suspicion for ongoing oozing.  After discussion with the patient's gastroenterology team he was taken to  endoscopy suite for definitive care.   Gerhard Munchobert Akane Tessier, MD 01/14/14 1406  5:06 PM Patient back from endoscopy. BP 150/80.  He has no new complaints. D/C home.  Gerhard Munchobert Hildegard Hlavac, MD 01/14/14 [phone]

## 2014-01-14 NOTE — Discharge Instructions (Signed)
Colitis °Colitis is inflammation of the colon. Colitis can be a short-term or long-standing (chronic) illness. Crohn's disease and ulcerative colitis are 2 types of colitis which are chronic. They usually require lifelong treatment. °CAUSES  °There are many different causes of colitis, including: °· Viruses. °· Germs (bacteria). °· Medicine reactions. °SYMPTOMS  °· Diarrhea. °· Intestinal bleeding. °· Pain. °· Fever. °· Throwing up (vomiting). °· Tiredness (fatigue). °· Weight loss. °· Bowel blockage. °DIAGNOSIS  °The diagnosis of colitis is based on examination and stool or blood tests. X-rays, CT scan, and colonoscopy may also be needed. °TREATMENT  °Treatment may include: °· Fluids given through the vein (intravenously). °· Bowel rest (nothing to eat or drink for a period of time). °· Medicine for pain and diarrhea. °· Medicines (antibiotics) that kill germs. °· Cortisone medicines. °· Surgery. °HOME CARE INSTRUCTIONS  °· Get plenty of rest. °· Drink enough water and fluids to keep your urine clear or pale yellow. °· Eat a well-balanced diet. °· Call your caregiver for follow-up as recommended. °SEEK IMMEDIATE MEDICAL CARE IF:  °· You develop chills. °· You have an oral temperature above 102° F (38.9° C), not controlled by medicine. °· You have extreme weakness, fainting, or dehydration. °· You have repeated vomiting. °· You develop severe belly (abdominal) pain or are passing bloody or tarry stools. °MAKE SURE YOU:  °· Understand these instructions. °· Will watch your condition. °· Will get help right away if you are not doing well or get worse. °Document Released: 11/14/2004 Document Revised: 12/30/2011 Document Reviewed: 02/09/2010 °ExitCare® Patient Information ©2014 [employer]. ° °

## 2014-01-14 NOTE — ED Notes (Signed)
Per Dr.Lockwood, xray to be d/c. Pt to go to endoscopy

## 2014-01-14 NOTE — ED Notes (Signed)
Lockwood MD at bedside. 

## 2014-01-14 NOTE — ED Notes (Signed)
MD at bedside. 

## 2014-01-17 ENCOUNTER — Encounter (HOSPITAL_COMMUNITY): Payer: Self-pay | Admitting: Gastroenterology

## 2014-01-20 ENCOUNTER — Other Ambulatory Visit (HOSPITAL_COMMUNITY): Payer: Self-pay | Admitting: *Deleted

## 2014-01-21 ENCOUNTER — Encounter (HOSPITAL_COMMUNITY)
Admission: RE | Admit: 2014-01-21 | Discharge: 2014-01-21 | Disposition: A | Discharge status: Home / Self Care | Payer: BC Managed Care – PPO | Source: Ambulatory Visit | Attending: Gastroenterology | Admitting: Gastroenterology

## 2014-01-21 DIAGNOSIS — K5100 Ulcerative (chronic) pancolitis without complications: Secondary | ICD-10-CM | POA: Insufficient documentation

## 2014-01-21 MED ORDER — DIPHENHYDRAMINE HCL 25 MG PO TABS
50.0000 mg | ORAL_TABLET | ORAL | Status: DC
  Administered 2014-01-21: 50 mg via ORAL
  Filled 2014-01-21: qty 2

## 2014-01-21 MED ORDER — ACETAMINOPHEN 500 MG PO TABS
ORAL_TABLET | ORAL | Status: AC
  Filled 2014-01-21: qty 2

## 2014-01-21 MED ORDER — SODIUM CHLORIDE 0.9 % IV SOLN
INTRAVENOUS | Status: DC
  Administered 2014-01-21: 250 mL via INTRAVENOUS

## 2014-01-21 MED ORDER — SODIUM CHLORIDE 0.9 % IV SOLN
1100.0000 mg | INTRAVENOUS | Status: DC
  Administered 2014-01-21: 1100 mg via INTRAVENOUS
  Filled 2014-01-21: qty 110

## 2014-01-21 MED ORDER — DIPHENHYDRAMINE HCL 25 MG PO CAPS
ORAL_CAPSULE | ORAL | Status: AC
  Filled 2014-01-21: qty 2

## 2014-01-21 MED ORDER — ACETAMINOPHEN 500 MG PO TABS
1000.0000 mg | ORAL_TABLET | ORAL | Status: DC
  Administered 2014-01-21: 1000 mg via ORAL

## 2014-03-18 ENCOUNTER — Encounter (HOSPITAL_COMMUNITY)
Admission: RE | Admit: 2014-03-18 | Discharge: 2014-03-18 | Disposition: A | Discharge status: Home / Self Care | Payer: BC Managed Care – PPO | Source: Ambulatory Visit | Attending: Gastroenterology | Admitting: Gastroenterology

## 2014-03-18 DIAGNOSIS — K5100 Ulcerative (chronic) pancolitis without complications: Secondary | ICD-10-CM | POA: Insufficient documentation

## 2014-03-18 MED ORDER — DIPHENHYDRAMINE HCL 25 MG PO CAPS
ORAL_CAPSULE | ORAL | Status: AC
  Filled 2014-03-18: qty 1

## 2014-03-18 MED ORDER — ACETAMINOPHEN 500 MG PO TABS
ORAL_TABLET | ORAL | Status: AC
  Filled 2014-03-18: qty 2

## 2014-03-18 MED ORDER — METHYLPREDNISOLONE SODIUM SUCC 125 MG IJ SOLR
INTRAMUSCULAR | Status: AC
  Administered 2014-03-18: 125 mg via INTRAVENOUS
  Filled 2014-03-18: qty 2

## 2014-03-18 MED ORDER — METHYLPREDNISOLONE SODIUM SUCC 125 MG IJ SOLR: 80.0000 mg | Freq: Once | INTRAMUSCULAR | Status: DC

## 2014-03-18 MED ORDER — SODIUM CHLORIDE 0.9 % IV SOLN
1100.0000 mg | INTRAVENOUS | Status: DC
  Administered 2014-03-18: 1100 mg via INTRAVENOUS
  Filled 2014-03-18: qty 110

## 2014-03-18 MED ORDER — DIPHENHYDRAMINE HCL 25 MG PO CAPS
ORAL_CAPSULE | ORAL | Status: AC
  Filled 2014-03-18: qty 2

## 2014-03-18 MED ORDER — ACETAMINOPHEN 500 MG PO TABS
1000.0000 mg | ORAL_TABLET | ORAL | Status: DC
  Administered 2014-03-18: 1000 mg via ORAL

## 2014-03-18 MED ORDER — SODIUM CHLORIDE 0.9 % IV SOLN: INTRAVENOUS | Status: DC

## 2014-03-18 MED ORDER — DIPHENHYDRAMINE HCL 25 MG PO TABS
50.0000 mg | ORAL_TABLET | ORAL | Status: DC
  Administered 2014-03-18: 50 mg via ORAL
  Filled 2014-03-18: qty 2

## 2014-03-18 NOTE — Progress Notes (Signed)
Entered patient room to titrate Remicade up. Patient c/o itching on his upper back, neck, abdomen. No rash visible. Stopped Remicade and called pharmacy and Dr. Randa Evens. Orders received. After Remicade had been stopped for 30 minutes, patient reported all itching gone. Solumedrol was given and Remicade restarted at 80 ml/hr.

## 2014-04-08 ENCOUNTER — Other Ambulatory Visit: Payer: Self-pay | Admitting: Gastroenterology

## 2014-04-08 DIAGNOSIS — R110 Nausea: Secondary | ICD-10-CM

## 2014-04-15 ENCOUNTER — Encounter (INDEPENDENT_AMBULATORY_CARE_PROVIDER_SITE_OTHER): Payer: Self-pay

## 2014-04-15 ENCOUNTER — Ambulatory Visit
Admission: RE | Admit: 2014-04-15 | Discharge: 2014-04-15 | Disposition: A | Discharge status: Home / Self Care | Payer: BC Managed Care – PPO | Source: Ambulatory Visit | Attending: Gastroenterology | Admitting: Gastroenterology

## 2014-04-15 DIAGNOSIS — R110 Nausea: Secondary | ICD-10-CM

## 2014-04-18 ENCOUNTER — Emergency Department (HOSPITAL_COMMUNITY): Payer: BC Managed Care – PPO

## 2014-04-18 ENCOUNTER — Encounter (HOSPITAL_COMMUNITY): Payer: Self-pay | Admitting: Emergency Medicine

## 2014-04-18 ENCOUNTER — Emergency Department (HOSPITAL_COMMUNITY)
Admission: EM | Admit: 2014-04-18 | Discharge: 2014-04-18 | Disposition: A | Discharge status: Home / Self Care | Payer: BC Managed Care – PPO | Attending: Emergency Medicine | Admitting: Emergency Medicine

## 2014-04-18 DIAGNOSIS — K51911 Ulcerative colitis, unspecified with rectal bleeding: Secondary | ICD-10-CM

## 2014-04-18 DIAGNOSIS — K5190 Ulcerative colitis, unspecified, without complications: Secondary | ICD-10-CM | POA: Insufficient documentation

## 2014-04-18 DIAGNOSIS — Z79899 Other long term (current) drug therapy: Secondary | ICD-10-CM | POA: Insufficient documentation

## 2014-04-18 DIAGNOSIS — J45909 Unspecified asthma, uncomplicated: Secondary | ICD-10-CM | POA: Insufficient documentation

## 2014-04-18 LAB — COMPREHENSIVE METABOLIC PANEL
ALBUMIN: 3.5 g/dL (ref 3.5–5.2)
ALT: 16 U/L (ref 0–53)
AST: 25 U/L (ref 0–37)
Alkaline Phosphatase: 58 U/L (ref 39–117)
BUN: 16 mg/dL (ref 6–23)
CALCIUM: 9 mg/dL (ref 8.4–10.5)
CO2: 28 mEq/L (ref 19–32)
Chloride: 102 mEq/L (ref 96–112)
Creatinine, Ser: 1.02 mg/dL (ref 0.50–1.35)
GFR calc Af Amer: >90 mL/min (ref >90)
GFR calc non Af Amer: 82 mL/min — ABNORMAL LOW (ref >90)
GLUCOSE: 82 mg/dL (ref 70–99)
POTASSIUM: 4.1 meq/L (ref 3.7–5.3)
Sodium: 140 mEq/L (ref 137–147)
TOTAL PROTEIN: 7.6 g/dL (ref 6.0–8.3)
Total Bilirubin: 0.6 mg/dL (ref 0.3–1.2)

## 2014-04-18 LAB — URINALYSIS, ROUTINE W REFLEX MICROSCOPIC
Bilirubin Urine: NEGATIVE
Glucose, UA: NEGATIVE mg/dL
Hgb urine dipstick: NEGATIVE
Ketones, ur: NEGATIVE mg/dL
Leukocytes, UA: NEGATIVE
NITRITE: NEGATIVE
Protein, ur: NEGATIVE mg/dL
SPECIFIC GRAVITY, URINE: 1.022 (ref 1.005–1.030)
Urobilinogen, UA: 0.2 mg/dL (ref 0.0–1.0)
pH: 5.5 (ref 5.0–8.0)

## 2014-04-18 LAB — CBC WITH DIFFERENTIAL/PLATELET
BASOS ABS: 0 10*3/uL (ref 0.0–0.1)
Basophils Relative: 1 % (ref 0–1)
Eosinophils Absolute: 0.3 10*3/uL (ref 0.0–0.7)
Eosinophils Relative: 6 % — ABNORMAL HIGH (ref 0–5)
HEMATOCRIT: 45.9 % (ref 39.0–52.0)
Hemoglobin: 16 g/dL (ref 13.0–17.0)
Lymphocytes Relative: 21 % (ref 12–46)
Lymphs Abs: 1.2 10*3/uL (ref 0.7–4.0)
MCH: 31.2 pg (ref 26.0–34.0)
MCHC: 34.9 g/dL (ref 30.0–36.0)
MCV: 89.5 fL (ref 78.0–100.0)
MONO ABS: 0.6 10*3/uL (ref 0.1–1.0)
Monocytes Relative: 10 % (ref 3–12)
NEUTROS ABS: 3.5 10*3/uL (ref 1.7–7.7)
NEUTROS PCT: 62 % (ref 43–77)
Platelets: 174 10*3/uL (ref 150–400)
RBC: 5.13 MIL/uL (ref 4.22–5.81)
RDW: 12.4 % (ref 11.5–15.5)
WBC: 5.6 10*3/uL (ref 4.0–10.5)

## 2014-04-18 LAB — LIPASE, BLOOD: LIPASE: 27 U/L (ref 11–59)

## 2014-04-18 LAB — POC OCCULT BLOOD, ED: FECAL OCCULT BLD: NEGATIVE

## 2014-04-18 MED ORDER — PREDNISONE 10 MG PO TABS: 20.0000 mg | ORAL_TABLET | Freq: Every day | ORAL | Status: DC

## 2014-04-18 MED ORDER — MORPHINE SULFATE 4 MG/ML IJ SOLN
4.0000 mg | Freq: Once | INTRAMUSCULAR | Status: AC
Start: 2014-04-18 — End: 2014-04-18
  Administered 2014-04-18: 4 mg via INTRAVENOUS
  Filled 2014-04-18: qty 1

## 2014-04-18 MED ORDER — PREDNISONE 20 MG PO TABS
20.0000 mg | ORAL_TABLET | Freq: Once | ORAL | Status: AC
Start: 2014-04-18 — End: 2014-04-18
  Administered 2014-04-18: 20 mg via ORAL
  Filled 2014-04-18: qty 1

## 2014-04-18 MED ORDER — ONDANSETRON HCL 4 MG/2ML IJ SOLN
4.0000 mg | Freq: Once | INTRAMUSCULAR | Status: AC
  Administered 2014-04-18: 4 mg via INTRAVENOUS
  Filled 2014-04-18: qty 2

## 2014-04-18 MED ORDER — IOHEXOL 300 MG/ML  SOLN
50.0000 mL | Freq: Once | INTRAMUSCULAR | Status: AC | PRN
  Administered 2014-04-18: 50 mL via ORAL

## 2014-04-18 MED ORDER — IOHEXOL 300 MG/ML  SOLN
100.0000 mL | Freq: Once | INTRAMUSCULAR | Status: AC | PRN
  Administered 2014-04-18: 100 mL via INTRAVENOUS

## 2014-04-18 MED ORDER — ONDANSETRON HCL 4 MG PO TABS: 4.0000 mg | ORAL_TABLET | Freq: Four times a day (QID) | ORAL | Status: DC

## 2014-04-18 MED ORDER — HYDROCODONE-ACETAMINOPHEN 5-325 MG PO TABS: 2.0000 | ORAL_TABLET | ORAL | Status: DC | PRN

## 2014-04-18 MED ORDER — SODIUM CHLORIDE 0.9 % IV BOLUS (SEPSIS)
1000.0000 mL | Freq: Once | INTRAVENOUS | Status: AC
  Administered 2014-04-18: 1000 mL via INTRAVENOUS

## 2014-04-18 NOTE — ED Notes (Signed)
Bed: [facility] Expected date:  Expected time:  Means of arrival:  Comments: 

## 2014-04-18 NOTE — ED Provider Notes (Signed)
CSN: [id number]     Arrival date & time 04/18/14  1007 History   First MD Initiated Contact with Patient 04/18/14 1150     Chief Complaint  Patient presents with  . Abdominal Pain  . Diarrhea     (Consider location/radiation/quality/duration/timing/severity/associated sxs/prior Treatment) HPI Comments: Patient with history of ulcerative colitis presenting with diarrhea has increased in the past 3 days. He noticed bloody diarrhea and crampy lower abdominal pain. Endorses nausea no vomiting. No fever. He is taking prednisone 5 mg every other day as well as mesalamine. He also gets Remicade injections every 8 weeks. Last injection was at the end of May. His GI doctor referred him to the ED today. He denies any chest pain or shortness of breath. He denies any weight loss. He denies any testicular pain or urinary symptoms. No fever.    The history is provided by the patient.    Past Medical History  Diagnosis Date  . Colitis, ulcerative chronic   . Asthma    Past Surgical History  Procedure Laterality Date  . Hernia repair    . Flexible sigmoidoscopy N/A 01/14/2014    Procedure: FLEXIBLE SIGMOIDOSCOPY;  Surgeon: Graylin Shiver, MD;  Location: [facility];  Service: Endoscopy;  Laterality: N/A;  . Umbilical hernia repair    . Undescended testicle surgery     History reviewed. No pertinent family history. History  Substance Use Topics  . Smoking status: Never Smoker   . Smokeless tobacco: Never Used  . Alcohol Use: 1.2 oz/week    2 Cans of beer per week     Comment: Rarely    Review of Systems  Constitutional: Positive for activity change, appetite change and fatigue. Negative for fever.  HENT: Negative for congestion and rhinorrhea.   Respiratory: Negative for cough, chest tightness and shortness of breath.   Cardiovascular: Negative for chest pain.  Gastrointestinal: Positive for nausea, abdominal pain and diarrhea. Negative for vomiting.  Genitourinary: Negative for dysuria  and hematuria.  Musculoskeletal: Positive for arthralgias and myalgias.  Skin: Negative for rash.  Neurological: Positive for weakness. Negative for dizziness.  A complete 10 system review of systems was obtained and all systems are negative except as noted in the HPI and PMH.      Allergies  Review of patient's allergies indicates no known allergies.  Home Medications   Prior to Admission medications   Medication Sig Start Date End Date Taking? Authorizing Provider  acetaminophen (TYLENOL) 500 MG tablet Take 1,000 mg by mouth 2 (two) times daily.   Yes Historical Provider, MD  albuterol (PROVENTIL HFA;VENTOLIN HFA) 108 (90 BASE) MCG/ACT inhaler Inhale 2 puffs into the lungs every 6 (six) hours as needed for wheezing or shortness of breath.   Yes Historical Provider, MD  mesalamine (APRISO) 0.375 G 24 hr capsule Take 1,500 mg by mouth daily.    Yes Historical Provider, MD  predniSONE (DELTASONE) 5 MG tablet Take 5 mg by mouth daily.    Yes Historical Provider, MD  HYDROcodone-acetaminophen (NORCO/VICODIN) 5-325 MG per tablet Take 2 tablets by mouth every 4 (four) hours as needed. 04/18/14   Glynn Octave, MD  ondansetron (ZOFRAN) 4 MG tablet Take 1 tablet (4 mg total) by mouth every 6 (six) hours. 04/18/14   Glynn Octave, MD  predniSONE (DELTASONE) 10 MG tablet Take 2 tablets (20 mg total) by mouth daily. 04/18/14   Glynn Octave, MD   BP 139/71  Pulse 57  Temp(Src) 98 F (36.7 C) (Oral)  Resp  17  SpO2 99% Physical Exam  Nursing note and vitals reviewed. Constitutional: He is oriented to person, place, and time. He appears well-developed and well-nourished. No distress.  HENT:  Head: Normocephalic and atraumatic.  Mouth/Throat: Oropharynx is clear and moist. No oropharyngeal exudate.  Eyes: Conjunctivae and EOM are normal. Pupils are equal, round, and reactive to light.  Neck: Normal range of motion. Neck supple.  No meningismus.  Cardiovascular: Normal rate, regular  rhythm, normal heart sounds and intact distal pulses.   No murmur heard. Pulmonary/Chest: Effort normal and breath sounds normal. No respiratory distress.  Abdominal: Soft. There is tenderness. There is no rebound and no guarding.  Diffuse abdominal pain  Genitourinary: Guaiac negative stool.  No hemorrhoids  Musculoskeletal: Normal range of motion. He exhibits no edema and no tenderness.  Neurological: He is alert and oriented to person, place, and time. No cranial nerve deficit. He exhibits normal muscle tone. Coordination normal.  No ataxia on finger to nose bilaterally. No pronator drift. 5/5 strength throughout. CN 2-12 intact. Negative Romberg. Equal grip strength. Sensation intact. Gait is normal.   Skin: Skin is warm.  Psychiatric: He has a normal mood and affect. His behavior is normal.    ED Course  Procedures (including critical care time) Labs Review Labs Reviewed  CBC WITH DIFFERENTIAL - Abnormal; Notable for the following:    Eosinophils Relative 6 (*)    All other components within normal limits  COMPREHENSIVE METABOLIC PANEL - Abnormal; Notable for the following:    GFR calc non Af Amer 82 (*)    All other components within normal limits  URINALYSIS, ROUTINE W REFLEX MICROSCOPIC - Abnormal; Notable for the following:    Color, Urine AMBER (*)    All other components within normal limits  LIPASE, BLOOD  POC OCCULT BLOOD, ED    Imaging Review Ct Abdomen Pelvis W Contrast  04/18/2014   CLINICAL DATA:  Lower abdominal pain. Intermittent diarrhea over the past 1 month, worse over the past 3 days. Current history of ulcerative colitis. Current history of hepatic cirrhosis.  EXAM: CT ABDOMEN AND PELVIS WITH CONTRAST  TECHNIQUE: Multidetector CT imaging of the abdomen and pelvis was performed using the standard protocol following bolus administration of intravenous contrast.  CONTRAST:  100 mL OMNIPAQUE IOHEXOL 300 MG/ML IV. Oral contrast was also administered.  COMPARISON:   CT abdomen and pelvis 07/02/2013, 12/29/2012. Abdominal ultrasound 04/14/2014, 07/09/2013.  FINDINGS: Thickening of the wall of the entire colon from cecum to rectum, with the greatest involvement in the cecum, ascending colon, and proximal transverse colon. No visible colonic mass. Small bowel normal in appearance. Stomach decompressed and unremarkable. No ascites.  Hepatic cirrhosis with irregular hepatic contour and relative enlargement of the left lobe, unchanged from the prior CTs. No hepatic masses. Splenomegaly, the spleen measuring approximately 14.9 x 9.5 x 14.3 cm, yielding a volume of approximately 1012 cc, not significantly changed from the prior examinations. No focal splenic parenchymal abnormality. Collateral vessel in the left upper quadrant which appears to be a spontaneous splenorenal shunt, unchanged.  Normal appearing pancreas and adrenal glands. Gallbladder unremarkable by CT. No biliary ductal dilation. Innumerable cysts involving both kidneys as noted previously. No solid renal masses. No evidence of hydronephrosis involving either kidney. 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 No significant lymphadenopathy.  Urinary bladder decompressed and unremarkable. Prostate gland and seminal vesicles normal for age.  Bone window images demonstrate chronic disc protrusions with calcifications in the posterior annular fibers at L2-3 and L3-4,  mild lower thoracic spondylosis, and degenerative changes involving the sacroiliac joints. Visualized lung bases clear. Heart size normal.  IMPRESSION: 1. Colitis involving the entire colon from cecum to rectum, with the greatest involvement in the cecum, ascending colon, and proximal transverse colon. 2. 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 No hepatic masses. 3. Stable polycystic kidneys.   Electronically Signed   By: Hulan Saashomas  Lawrence M.D.   On: 04/18/2014  14:40     EKG Interpretation None      MDM   Final diagnoses:  Exacerbation of ulcerative colitis, with rectal bleeding   Patient presents from home with intermittent abdominal pain, bloody diarrhea and nausea. History of ulcerative colitis. Abdomen soft without peritoneal signs. Patient appears nontoxic and well-hydrated. Abdomen is soft.  Laboratory studies at baseline. No leukoCytosis. No fever. Hemoccult negative. CT scan shows colitis involving the entire colon with stable findings of hepatic cirrhosis.  CT findings discussed with patient's GI doctor Dr. Randa EvensEdwards. He recommends increasing the prednisone to 20 mg twice a day. Followup in the office in the next 2 days. Clear liquid diet. Remind patient to avoid NSAIDs.  Patient appears well-hydrated. Labs were baseline. Vitals are stable. Abdomen soft without peritoneal signs. Patient to followup with Dr. Randa EvensEdwards in the next 2 days. Return precautions discussed.  BP 139/71  Pulse 57  Temp(Src) 98 F (36.7 C) (Oral)  Resp 17  SpO2 99%   Glynn OctaveStephen Rancour, MD 04/18/14 [phone]

## 2014-04-18 NOTE — ED Notes (Signed)
Pt transported to CT via stretcher with Tech 

## 2014-04-18 NOTE — ED Notes (Signed)
Radiology bedside to provide PO contrast with instruction

## 2014-04-18 NOTE — Discharge Instructions (Signed)
Ulcerative Colitis Increase your prednisone to 20 mg twice daily until you see Dr. Randa EvensEdwards in the next 2 days. Return to the ED if you develop worsening pain, fever, vomiting or any other concerns. Ulcerative colitis is a long lasting swelling and soreness (inflammation) of the colon (large intestine). In patients with ulcerative colitis, sores (ulcers) and inflammation of the inner lining of the colon lead to illness. Ulcerative colitis can also cause problems outside the digestive tract.  Ulcerative colitis is closely related to another condition of inflammation of the intestines called Crohn's disease. Together, they are frequently referred to as inflammatory bowel disease (IBD). Ulcerative colitis and Crohn's diseases are conditions that can last years to decades. Men and women are affected equally. They most commonly begin during adolescence and early adulthood. SYMPTOMS  Common symptoms of ulcerative colitis include rectal bleeding and diarrhea. There is a wide range of symptoms among patients with this disease depending on how severe the disease is. Some of these symptoms are:  Abdominal pain or cramping.  Diarrhea.  Fever.  Tiredness (fatigue).  Weight loss.  Night sweats.  Rectal pain.  Feeling the immediate need to have a bowel movement (rectal urgency). CAUSES  Ulcerative colitis is caused by increased activity of the immune system in the intestines. The immune system is the system that protects the body against disease such as harmful bacteria, viruses, fungi, and other foreign invaders. When the immune system overacts, it causes inflammation. The cause of the increased immune system activity is not known. This over activity causes long-lasting inflammation and ulceration. This condition may be passed down from your parents (inherited). Brothers, sisters, children, and parents of patients with IBD are more likely to develop these diseases. It is not contagious. This means you  cannot catch it from someone else. DIAGNOSIS  Your caregiver may suspect ulcerative colitis based on your symptoms and exam. Blood tests may confirm that there is a problem. You may be asked to submit a stool specimen for examination. X-rays and CT scans may be necessary. Ultimately, the diagnosis is usually made after a flexible tube is inserted via your anus and your colon is examined under sedation (colonoscopy). With this test, the specialist can take a tiny tissue sample from inside the bowel (biopsy). Examination of this biopsy tissue under a microscopy can reveal ulcerative colitis as the cause of your symptoms. TREATMENT   There is no cure for ulcerative colitis.  Complications such as massive bleeding from the colon (hemorrhage), development of a hole in the colon (perforation), or the development of precancerous or cancerous changes of the colon may require surgery.  Medications are often used to decrease inflammation and control the immune system. These include medicines related to aspirin, steroid medications, and newer and stronger medications to slow down the immune system. Some medications may be used as suppositories or enemas. A number of other medications are used or have been studied. Your caregiver will make specific recommendations. HOME CARE INSTRUCTIONS   There is no cure for ulcerative colitis disease. The best treatment is frequent checkups with your caregiver. Periodic reevaluation is important.  Symptoms such as diarrhea can be controlled with medications. Avoid foods that have a laxative effect such fresh fruit and vegetables and dairy products. During flare ups, you can rest your bowel by staying away from solid foods. Drink clear liquids frequently during the day. Electrolyte or rehydrating fluids are best. Your caregiver can help you with suggestions. Drink often to prevent dehydration. When diarrhea has  cleared, eat smaller meals and more often. Avoid food additives and  stimulants such as caffeine (coffee, tea, many sodas, or chocolate). Avoid dairy products. Enzyme supplements may help if you develop intolerance to a sugar in dairy products (lactose). Ask your caregiver or dietitian about specific dietary instructions.  If you had surgery, be sure you understand your care instructions thoroughly, including proper care of any surgical wounds.  Take any medications exactly as prescribed.  Try to maintain a positive attitude. Learn relaxation techniques such as self hypnosis, mental imaging, and muscle relaxation. If possible, avoid stresses that aggravate your condition. Exercise regularly. Follow your diet. Always get plenty of rest. SEEK MEDICAL CARE IF:   Your symptoms fail to improve after a week or two of new treatment.  You experience continued weight loss.  You have ongoing crampy digestion or loose bowels.  You develop a new skin rash, skin sores, or eye problems. SEEK IMMEDIATE MEDICAL CARE IF:   You have worsening of your symptoms or develop new symptoms.  You have an oral temperature above 102 F (38.9 C), not controlled by medicine.  You develop bloody diarrhea.  You have severe abdominal pain. Document Released: 07/17/2005 Document Revised: 12/30/2011 Document Reviewed: 06/16/2007 [facility]Exit[facility] Patient Information 2015 [place], [place]. This information is not intended to replace advice given to you by your health care provider. Make sure you discuss any questions you have with your health care provider.

## 2014-04-18 NOTE — ED Notes (Signed)
Pt with hx of ulcerative colitis.  Has had diarrhea off/on for a month with increase in last 3 days.  Pt states now noticing blood in stool and having lower abdominal pain. No n/v.  No fever.

## 2014-05-13 ENCOUNTER — Encounter (HOSPITAL_COMMUNITY): Payer: BC Managed Care – PPO

## 2014-06-16 ENCOUNTER — Other Ambulatory Visit (HOSPITAL_COMMUNITY): Payer: Self-pay | Admitting: *Deleted

## 2014-06-17 ENCOUNTER — Encounter (HOSPITAL_COMMUNITY)
Admission: RE | Admit: 2014-06-17 | Discharge: 2014-06-17 | Disposition: A | Discharge status: Home / Self Care | Payer: BC Managed Care – PPO | Source: Ambulatory Visit | Attending: Gastroenterology | Admitting: Gastroenterology

## 2014-06-17 DIAGNOSIS — K5100 Ulcerative (chronic) pancolitis without complications: Secondary | ICD-10-CM | POA: Diagnosis present

## 2014-06-17 MED ORDER — VEDOLIZUMAB 300 MG IV SOLR
300.0000 mg | INTRAVENOUS | Status: DC
  Administered 2014-06-17: 300 mg via INTRAVENOUS
  Filled 2014-06-17: qty 5

## 2014-06-30 ENCOUNTER — Other Ambulatory Visit (HOSPITAL_COMMUNITY): Payer: Self-pay | Admitting: *Deleted

## 2014-07-01 ENCOUNTER — Encounter (HOSPITAL_COMMUNITY)
Admission: RE | Admit: 2014-07-01 | Discharge: 2014-07-01 | Disposition: A | Discharge status: Home / Self Care | Payer: BC Managed Care – PPO | Source: Ambulatory Visit | Attending: Gastroenterology | Admitting: Gastroenterology

## 2014-07-01 DIAGNOSIS — K5100 Ulcerative (chronic) pancolitis without complications: Secondary | ICD-10-CM | POA: Insufficient documentation

## 2014-07-01 MED ORDER — SODIUM CHLORIDE 0.9 % IV SOLN
INTRAVENOUS | Status: DC
Start: 2014-07-01 — End: 2014-07-02
  Administered 2014-07-01: 13:00:00 via INTRAVENOUS

## 2014-07-01 MED ORDER — VEDOLIZUMAB 300 MG IV SOLR
300.0000 mg | INTRAVENOUS | Status: DC
  Administered 2014-07-01: 300 mg via INTRAVENOUS
  Filled 2014-07-01: qty 5

## 2014-08-04 ENCOUNTER — Other Ambulatory Visit (HOSPITAL_COMMUNITY): Payer: Self-pay | Admitting: *Deleted

## 2014-08-05 ENCOUNTER — Encounter (HOSPITAL_COMMUNITY)
Admission: RE | Admit: 2014-08-05 | Discharge: 2014-08-05 | Disposition: A | Discharge status: Home / Self Care | Payer: BC Managed Care – PPO | Source: Ambulatory Visit | Attending: Gastroenterology | Admitting: Gastroenterology

## 2014-08-05 DIAGNOSIS — K51019 Ulcerative (chronic) pancolitis with unspecified complications: Secondary | ICD-10-CM | POA: Diagnosis present

## 2014-08-05 MED ORDER — SODIUM CHLORIDE 0.9 % IV SOLN
INTRAVENOUS | Status: DC
  Administered 2014-08-05: 14:00:00 via INTRAVENOUS

## 2014-08-05 MED ORDER — VEDOLIZUMAB 300 MG IV SOLR
300.0000 mg | INTRAVENOUS | Status: DC
  Administered 2014-08-05: 300 mg via INTRAVENOUS
  Filled 2014-08-05: qty 5

## 2014-09-29 ENCOUNTER — Other Ambulatory Visit (HOSPITAL_COMMUNITY): Payer: Self-pay | Admitting: *Deleted

## 2014-09-30 ENCOUNTER — Encounter (HOSPITAL_COMMUNITY)
Admission: RE | Admit: 2014-09-30 | Discharge: 2014-09-30 | Disposition: A | Discharge status: Home / Self Care | Payer: BC Managed Care – PPO | Source: Ambulatory Visit | Attending: Gastroenterology | Admitting: Gastroenterology

## 2014-09-30 DIAGNOSIS — K51019 Ulcerative (chronic) pancolitis with unspecified complications: Secondary | ICD-10-CM | POA: Diagnosis not present

## 2014-09-30 MED ORDER — VEDOLIZUMAB 300 MG IV SOLR
300.0000 mg | INTRAVENOUS | Status: DC
  Administered 2014-09-30: 300 mg via INTRAVENOUS
  Filled 2014-09-30: qty 5

## 2014-09-30 MED ORDER — SODIUM CHLORIDE 0.9 % IV SOLN: INTRAVENOUS | Status: DC

## 2014-12-08 ENCOUNTER — Other Ambulatory Visit (HOSPITAL_COMMUNITY): Payer: Self-pay | Admitting: *Deleted

## 2014-12-09 ENCOUNTER — Encounter (HOSPITAL_COMMUNITY)
Admission: RE | Admit: 2014-12-09 | Discharge: 2014-12-09 | Disposition: A | Discharge status: Home / Self Care | Payer: Self-pay | Source: Ambulatory Visit | Attending: Gastroenterology | Admitting: Gastroenterology

## 2014-12-09 DIAGNOSIS — K51019 Ulcerative (chronic) pancolitis with unspecified complications: Secondary | ICD-10-CM | POA: Insufficient documentation

## 2014-12-09 MED ORDER — VEDOLIZUMAB 300 MG IV SOLR
300.0000 mg | INTRAVENOUS | Status: DC
  Administered 2014-12-09: 300 mg via INTRAVENOUS
  Filled 2014-12-09: qty 5

## 2014-12-09 MED ORDER — SODIUM CHLORIDE 0.9 % IV SOLN
INTRAVENOUS | Status: DC
  Administered 2014-12-09: 250 mL via INTRAVENOUS

## 2015-02-24 ENCOUNTER — Encounter (HOSPITAL_COMMUNITY)
Admission: RE | Admit: 2015-02-24 | Discharge: 2015-02-24 | Disposition: A | Discharge status: Home / Self Care | Payer: BLUE CROSS/BLUE SHIELD | Source: Ambulatory Visit | Attending: Gastroenterology | Admitting: Gastroenterology

## 2015-02-24 DIAGNOSIS — K51019 Ulcerative (chronic) pancolitis with unspecified complications: Secondary | ICD-10-CM | POA: Diagnosis present

## 2015-02-24 MED ORDER — VEDOLIZUMAB 300 MG IV SOLR
300.0000 mg | INTRAVENOUS | Status: AC
  Administered 2015-02-24: 300 mg via INTRAVENOUS
  Filled 2015-02-24: qty 5

## 2015-02-24 MED ORDER — SODIUM CHLORIDE 0.9 % IV SOLN
INTRAVENOUS | Status: AC
  Administered 2015-02-24: 14:00:00 via INTRAVENOUS

## 2015-07-04 ENCOUNTER — Other Ambulatory Visit (HOSPITAL_COMMUNITY): Payer: Self-pay | Admitting: *Deleted

## 2015-07-05 ENCOUNTER — Encounter (HOSPITAL_COMMUNITY)
Admission: RE | Admit: 2015-07-05 | Discharge: 2015-07-05 | Disposition: A | Discharge status: Home / Self Care | Payer: BLUE CROSS/BLUE SHIELD | Source: Ambulatory Visit | Attending: Gastroenterology | Admitting: Gastroenterology

## 2015-07-05 DIAGNOSIS — K5180 Other ulcerative colitis without complications: Secondary | ICD-10-CM | POA: Insufficient documentation

## 2015-07-05 MED ORDER — VEDOLIZUMAB 300 MG IV SOLR
300.0000 mg | INTRAVENOUS | Status: DC
  Administered 2015-07-05: 300 mg via INTRAVENOUS
  Filled 2015-07-05: qty 5

## 2015-07-05 MED ORDER — SODIUM CHLORIDE 0.9 % IV SOLN
INTRAVENOUS | Status: DC
  Administered 2015-07-05: 13:00:00 via INTRAVENOUS

## 2015-09-01 ENCOUNTER — Encounter (HOSPITAL_COMMUNITY)
Admission: RE | Admit: 2015-09-01 | Discharge: 2015-09-01 | Disposition: A | Discharge status: Home / Self Care | Payer: BLUE CROSS/BLUE SHIELD | Source: Ambulatory Visit | Attending: Gastroenterology | Admitting: Gastroenterology

## 2015-09-01 DIAGNOSIS — K5180 Other ulcerative colitis without complications: Secondary | ICD-10-CM | POA: Insufficient documentation

## 2015-09-01 MED ORDER — SODIUM CHLORIDE 0.9 % IV SOLN
INTRAVENOUS | Status: DC
  Administered 2015-09-01: 13:00:00 via INTRAVENOUS

## 2015-09-01 MED ORDER — VEDOLIZUMAB 300 MG IV SOLR
300.0000 mg | INTRAVENOUS | Status: DC
  Administered 2015-09-01: 300 mg via INTRAVENOUS
  Filled 2015-09-01: qty 5

## 2015-10-27 ENCOUNTER — Inpatient Hospital Stay (HOSPITAL_COMMUNITY): Admission: RE | Admit: 2015-10-27 | Payer: BLUE CROSS/BLUE SHIELD | Source: Ambulatory Visit

## 2017-01-08 ENCOUNTER — Emergency Department (HOSPITAL_COMMUNITY)
Admission: EM | Admit: 2017-01-08 | Discharge: 2017-01-08 | Disposition: A | Discharge status: Home / Self Care | Payer: BLUE CROSS/BLUE SHIELD | Attending: Emergency Medicine | Admitting: Emergency Medicine

## 2017-01-08 ENCOUNTER — Encounter (HOSPITAL_COMMUNITY): Payer: Self-pay | Admitting: Emergency Medicine

## 2017-01-08 DIAGNOSIS — Z7984 Long term (current) use of oral hypoglycemic drugs: Secondary | ICD-10-CM | POA: Diagnosis not present

## 2017-01-08 DIAGNOSIS — E119 Type 2 diabetes mellitus without complications: Secondary | ICD-10-CM

## 2017-01-08 DIAGNOSIS — E1165 Type 2 diabetes mellitus with hyperglycemia: Secondary | ICD-10-CM | POA: Insufficient documentation

## 2017-01-08 DIAGNOSIS — J45909 Unspecified asthma, uncomplicated: Secondary | ICD-10-CM | POA: Diagnosis not present

## 2017-01-08 HISTORY — DX: Type 2 diabetes mellitus without complications: E11.9

## 2017-01-08 LAB — URINALYSIS, ROUTINE W REFLEX MICROSCOPIC
Bacteria, UA: NONE SEEN
Bilirubin Urine: NEGATIVE
Glucose, UA: >=500 mg/dL — AB
Hgb urine dipstick: NEGATIVE
Ketones, ur: NEGATIVE mg/dL
Leukocytes, UA: NEGATIVE
NITRITE: NEGATIVE
PROTEIN: NEGATIVE mg/dL
Specific Gravity, Urine: 1.026 (ref 1.005–1.030)
Squamous Epithelial / LPF: NONE SEEN
pH: 5 (ref 5.0–8.0)

## 2017-01-08 LAB — BASIC METABOLIC PANEL
Anion gap: 11 (ref 5–15)
BUN: 17 mg/dL (ref 6–20)
CALCIUM: 8.7 mg/dL — AB (ref 8.9–10.3)
CO2: 23 mmol/L (ref 22–32)
Chloride: 95 mmol/L — ABNORMAL LOW (ref 101–111)
Creatinine, Ser: 1.36 mg/dL — ABNORMAL HIGH (ref 0.61–1.24)
GFR, EST NON AFRICAN AMERICAN: 57 mL/min — AB (ref >60)
Glucose, Bld: 581 mg/dL (ref 65–99)
Potassium: 3.4 mmol/L — ABNORMAL LOW (ref 3.5–5.1)
Sodium: 129 mmol/L — ABNORMAL LOW (ref 135–145)

## 2017-01-08 LAB — CBC
HCT: 42.5 % (ref 39.0–52.0)
HEMOGLOBIN: 15.5 g/dL (ref 13.0–17.0)
MCH: 30.9 pg (ref 26.0–34.0)
MCHC: 36.5 g/dL — ABNORMAL HIGH (ref 30.0–36.0)
MCV: 84.7 fL (ref 78.0–100.0)
Platelets: 232 10*3/uL (ref 150–400)
RBC: 5.02 MIL/uL (ref 4.22–5.81)
RDW: 12.5 % (ref 11.5–15.5)
WBC: 10.5 10*3/uL (ref 4.0–10.5)

## 2017-01-08 LAB — CLOSTRIDIUM DIFFICILE BY PCR: Toxigenic C. Difficile by PCR: POSITIVE — AB

## 2017-01-08 LAB — C DIFFICILE QUICK SCREEN W PCR REFLEX
C Diff antigen: POSITIVE — AB
C Diff toxin: NEGATIVE

## 2017-01-08 LAB — CBG MONITORING, ED
GLUCOSE-CAPILLARY: 548 mg/dL — AB (ref 65–99)
Glucose-Capillary: 353 mg/dL — ABNORMAL HIGH (ref 65–99)

## 2017-01-08 MED ORDER — GLIMEPIRIDE 1 MG PO TABS: 1.0000 mg | ORAL_TABLET | Freq: Every day | ORAL | Status: DC

## 2017-01-08 MED ORDER — GLIMEPIRIDE 1 MG PO TABS: 1.0000 mg | ORAL_TABLET | Freq: Every day | ORAL | 0 refills | Status: AC

## 2017-01-08 MED ORDER — SODIUM CHLORIDE 0.9 % IV BOLUS (SEPSIS)
1000.0000 mL | Freq: Once | INTRAVENOUS | Status: AC
  Administered 2017-01-08: 1000 mL via INTRAVENOUS

## 2017-01-08 MED ORDER — PREDNISONE 10 MG PO TABS: 60.0000 mg | ORAL_TABLET | Freq: Every day | ORAL | 0 refills | Status: DC

## 2017-01-08 MED ORDER — GLIMEPIRIDE 1 MG PO TABS
1.0000 mg | ORAL_TABLET | Freq: Once | ORAL | Status: AC
  Administered 2017-01-08: 1 mg via ORAL
  Filled 2017-01-08: qty 1

## 2017-01-08 MED ORDER — BLOOD GLUCOSE MONITOR KIT: PACK | 0 refills | Status: AC

## 2017-01-08 NOTE — ED Triage Notes (Signed)
Pt here for hyperglycemia and dehydration. Pt was seen by primary MD and was called for labs showing that. Pt thirsty, frequent urination, decreased appetite.

## 2017-01-08 NOTE — ED Provider Notes (Signed)
[facility] DEPT Provider Note   CSN: [id number] Arrival date & time: 01/08/17  1607     History   Chief Complaint Chief Complaint  Patient presents with  . Hyperglycemia    HPI [name] is a 56 y.o. male.  HPI  56 year old male with history of ulcerative colitis on entyvia infusions and asthma who presents at the request of his PCP for evaluation of hyperglycemia. Patient denies prior history of diabetes. States that for the last week he's had extreme polyuria and polydipsia. He has also lost approximately 10 pounds. States he feels like he just can't consume enough water to keep up. Blood sugar was 548 at his PCPs, prompting him to be sent him here for evaluation. Additionally, pt endorses intermittent blurry vision and lightheadedness with standing over the same period of time.   Pt also states that he has been having increased diarrhea for the past month. He tested positive for C. difficile about a month ago and was treated with a course of PO flagyl that ended about 1 week ago. States that despite compliance with the treatment, his diarrhea did not improve. Diarrhea is described as watery and nonbloody. He was recently started on entyvio infusions for treatment of his UC, and is unsure whether the diarrhea is 2/2 his UC vs. Recurrent C.diff.     Past Medical History:  Diagnosis Date  . Asthma   . Colitis, ulcerative chronic ([place])   . Diabetes mellitus without complication ([place])     There are no active problems to display for this patient.   Past Surgical History:  Procedure Laterality Date  . FLEXIBLE SIGMOIDOSCOPY N/A 01/14/2014   Procedure: FLEXIBLE SIGMOIDOSCOPY;  Surgeon: Wonda Horner, MD;  Location: [facility];  Service: Endoscopy;  Laterality: N/A;  . HERNIA REPAIR    . UMBILICAL HERNIA REPAIR    . Undescended testicle Surgery         Home Medications    Prior to Admission medications   Medication Sig Start Date End Date Taking? Authorizing  Provider  acetaminophen (TYLENOL) 500 MG tablet Take 1,000 mg by mouth 2 (two) times daily.    Historical Provider, MD  albuterol (PROVENTIL HFA;VENTOLIN HFA) 108 (90 BASE) MCG/ACT inhaler Inhale 2 puffs into the lungs every 6 (six) hours as needed for wheezing or shortness of breath.    Historical Provider, MD  blood glucose meter kit and supplies KIT Dispense based on patient and insurance preference. Use up to four times daily as directed. (FOR ICD-9 250.00, 250.01). 01/08/17   Jenifer Algernon Huxley, MD  glimepiride (AMARYL) 1 MG tablet Take 1 tablet (1 mg total) by mouth daily with breakfast. 01/08/17 01/23/17  Jenifer Algernon Huxley, MD  HYDROcodone-acetaminophen (NORCO/VICODIN) 5-325 MG per tablet Take 2 tablets by mouth every 4 (four) hours as needed. 04/18/14   Ezequiel Essex, MD  mesalamine (APRISO) 0.375 G 24 hr capsule Take 1,500 mg by mouth daily.     Historical Provider, MD  ondansetron (ZOFRAN) 4 MG tablet Take 1 tablet (4 mg total) by mouth every 6 (six) hours. 04/18/14   Ezequiel Essex, MD  traMADol (ULTRAM) 50 MG tablet Take by mouth 2 (two) times daily. Two tabs in am and 1 in pm    Historical Provider, MD    Family History History reviewed. No pertinent family history.  Social History Social History  Substance Use Topics  . Smoking status: Never Smoker  . Smokeless tobacco: Never Used  . Alcohol use 1.2 oz/week  2 Cans of beer per week     Comment: Rarely     Allergies   Remicade [infliximab]   Review of Systems Review of Systems  Constitutional: Negative for chills, fatigue and fever.  HENT: Negative for ear pain and sore throat.   Eyes: Negative for pain and visual disturbance.  Respiratory: Negative for cough, shortness of breath and wheezing.   Cardiovascular: Negative for chest pain and palpitations.  Gastrointestinal: Positive for abdominal pain (cramping) and diarrhea. Negative for blood in stool, constipation, nausea and vomiting.  Endocrine: Positive for  polydipsia and polyuria.  Genitourinary: Negative for dysuria, flank pain and hematuria.  Musculoskeletal: Negative for arthralgias and back pain.  Skin: Negative for color change and rash.  Neurological: Positive for light-headedness. Negative for dizziness, seizures, syncope, weakness, numbness and headaches.  Psychiatric/Behavioral: Negative for agitation, behavioral problems and confusion.     Physical Exam Updated Vital Signs BP (!) 150/76   Pulse 65   Temp 98.3 F (36.8 C) (Oral)   Resp (!) 21   Ht '5\' 11"'$  (1.803 m)   Wt 101.2 kg   SpO2 99%   BMI 31.10 kg/m   Physical Exam  Constitutional: He is oriented to person, place, and time. He appears well-developed and well-nourished. No distress.  HENT:  Head: Normocephalic and atraumatic.  Right Ear: External ear normal.  Left Ear: External ear normal.  Nose: Nose normal.  Dry mucous membranes  Eyes: Conjunctivae and EOM are normal. Pupils are equal, round, and reactive to light.  Neck: Normal range of motion. Neck supple.  Cardiovascular: Normal rate, regular rhythm, normal heart sounds and intact distal pulses.  Exam reveals no gallop and no friction rub.   No murmur heard. Pulmonary/Chest: Effort normal and breath sounds normal. No respiratory distress. He has no wheezes. He has no rales.  Abdominal: Soft. Bowel sounds are normal. He exhibits no distension. There is tenderness (mild, generalized). There is no guarding.  Musculoskeletal: Normal range of motion. He exhibits no edema.  Neurological: He is alert and oriented to person, place, and time. He exhibits normal muscle tone.  Skin: Skin is warm and dry. He is not diaphoretic.  Psychiatric: He has a normal mood and affect.  Nursing note and vitals reviewed.    ED Treatments / Results  Labs (all labs ordered are listed, but only abnormal results are displayed) Labs Reviewed  C DIFFICILE QUICK SCREEN W PCR REFLEX - Abnormal; Notable for the following:        Result Value   C Diff antigen POSITIVE (*)    All other components within normal limits  BASIC METABOLIC PANEL - Abnormal; Notable for the following:    Sodium 129 (*)    Potassium 3.4 (*)    Chloride 95 (*)    Glucose, Bld 581 (*)    Creatinine, Ser 1.36 (*)    Calcium 8.7 (*)    GFR calc non Af Amer 57 (*)    All other components within normal limits  CBC - Abnormal; Notable for the following:    MCHC 36.5 (*)    All other components within normal limits  URINALYSIS, ROUTINE W REFLEX MICROSCOPIC - Abnormal; Notable for the following:    Glucose, UA >=500 (*)    All other components within normal limits  CBG MONITORING, ED - Abnormal; Notable for the following:    Glucose-Capillary 548 (*)    All other components within normal limits  CBG MONITORING, ED - Abnormal; Notable for the  following:    Glucose-Capillary 353 (*)    All other components within normal limits  CLOSTRIDIUM DIFFICILE BY PCR    EKG  EKG Interpretation None       Radiology No results found.  Procedures Procedures (including critical care time)  Medications Ordered in ED Medications  sodium chloride 0.9 % bolus 1,000 mL (0 mLs Intravenous Stopped 01/08/17 1927)  sodium chloride 0.9 % bolus 1,000 mL (0 mLs Intravenous Stopped 01/08/17 2208)  sodium chloride 0.9 % bolus 1,000 mL (1,000 mLs Intravenous New Bag/Given 01/08/17 2209)  glimepiride (AMARYL) tablet 1 mg (1 mg Oral Given 01/08/17 2106)     Initial Impression / Assessment and Plan / ED Course  I have reviewed the triage vital signs and the nursing notes.  Pertinent labs & imaging results that were available during my care of the patient were reviewed by me and considered in my medical decision making (see chart for details).    Generally well-appearing but with dry mucous membranes. Afebrile and hemodynamically stable. Blood glucose elevated to 581. No anion gap and normal bicarbonate. Doubt DKA or hyperosmolar state. Patient also has an AKI  with creatinine of 1.36. Suspect that this is prerenal in the setting of dehydration from polyuria. Patient given 3 L of normal saline throughout his time in the emergency department and he no longer feels lightheaded with standing.   Given persistent diarrhea, C. difficile testing performed that is negative for the toxin. Suspect that the patient's diarrhea is secondary to his ulcerative colitis. Recommend f/u with his gastroenterologist for continuing management. He has mild diffuse TTP on abdominal exam, which he states is his baseline. No indication for emergent CT abdomen at this time.   Given diarrhea, will not start the patient on metformin as this is a known side effect of this medication. Will start him on 1 mg of glimepiride daily with breakfast. Patient given a dose of this in the ED, and tolerated the medication as well as a meal. Repeat blood glucose is 353. As the pt is asymptomatic without evidence of emergent complication, there is no indication for hospitalization at this time. Pt confirms that he can follow-up with his PCP in the next couple of days. Prescription provided for glucose testing supplies as well as teaching. Discussed with the patient the risk of hypoglycemia with glimepiride, and emphasized to him the importance that he follow-up with his PCP in the next couple of days for repeat glucose testing and test his blood sugar immediately for signs or symptoms of hypoglycemia. He was instructed to return to the emergency department immediately for any new or worsening symptoms. He was discharged in stable condition.  Care of pt overseen by my attending, Dr. Oleta Mouse.  Final Clinical Impressions(s) / ED Diagnoses   Final diagnoses:  New onset type 2 diabetes mellitus ([place])    New Prescriptions New Prescriptions   BLOOD GLUCOSE METER KIT AND SUPPLIES KIT    Dispense based on patient and insurance preference. Use up to four times daily as directed. (FOR ICD-9 250.00, 250.01).    GLIMEPIRIDE (AMARYL) 1 MG TABLET    Take 1 tablet (1 mg total) by mouth daily with breakfast.     Zipporah Plants, MD 01/08/17 1594    Forde Dandy, MD 01/09/17 [phone]

## 2017-01-08 NOTE — ED Notes (Signed)
Pt is aware of need for urine and stool specimen.  

## 2017-01-08 NOTE — ED Notes (Signed)
Pt given happy meal and diet coke 

## 2017-06-21 ENCOUNTER — Telehealth: Payer: Self-pay | Admitting: *Deleted

## 2017-06-21 ENCOUNTER — Emergency Department (HOSPITAL_COMMUNITY)
Admission: EM | Admit: 2017-06-21 | Discharge: 2017-06-21 | Disposition: A | Discharge status: Home / Self Care | Payer: BLUE CROSS/BLUE SHIELD | Attending: Emergency Medicine | Admitting: Emergency Medicine

## 2017-06-21 ENCOUNTER — Emergency Department (HOSPITAL_COMMUNITY): Payer: BLUE CROSS/BLUE SHIELD

## 2017-06-21 ENCOUNTER — Encounter (HOSPITAL_COMMUNITY): Payer: Self-pay | Admitting: Emergency Medicine

## 2017-06-21 DIAGNOSIS — J45909 Unspecified asthma, uncomplicated: Secondary | ICD-10-CM | POA: Diagnosis not present

## 2017-06-21 DIAGNOSIS — Z7984 Long term (current) use of oral hypoglycemic drugs: Secondary | ICD-10-CM | POA: Diagnosis not present

## 2017-06-21 DIAGNOSIS — R1032 Left lower quadrant pain: Secondary | ICD-10-CM | POA: Diagnosis present

## 2017-06-21 DIAGNOSIS — E119 Type 2 diabetes mellitus without complications: Secondary | ICD-10-CM | POA: Insufficient documentation

## 2017-06-21 DIAGNOSIS — Z79899 Other long term (current) drug therapy: Secondary | ICD-10-CM | POA: Insufficient documentation

## 2017-06-21 DIAGNOSIS — N200 Calculus of kidney: Secondary | ICD-10-CM | POA: Diagnosis not present

## 2017-06-21 LAB — BASIC METABOLIC PANEL
Anion gap: 11 (ref 5–15)
BUN: 19 mg/dL (ref 6–20)
CHLORIDE: 101 mmol/L (ref 101–111)
CO2: 23 mmol/L (ref 22–32)
CREATININE: 1.87 mg/dL — AB (ref 0.61–1.24)
Calcium: 9.2 mg/dL (ref 8.9–10.3)
GFR calc Af Amer: 44 mL/min — ABNORMAL LOW (ref >60)
GFR calc non Af Amer: 38 mL/min — ABNORMAL LOW (ref >60)
Glucose, Bld: 98 mg/dL (ref 65–99)
Potassium: 3.9 mmol/L (ref 3.5–5.1)
Sodium: 135 mmol/L (ref 135–145)

## 2017-06-21 LAB — URINALYSIS, ROUTINE W REFLEX MICROSCOPIC
Bilirubin Urine: NEGATIVE
Glucose, UA: NEGATIVE mg/dL
Hgb urine dipstick: NEGATIVE
Ketones, ur: NEGATIVE mg/dL
Leukocytes, UA: NEGATIVE
Nitrite: NEGATIVE
PH: 5 (ref 5.0–8.0)
PROTEIN: NEGATIVE mg/dL
Specific Gravity, Urine: 1.029 (ref 1.005–1.030)

## 2017-06-21 MED ORDER — OXYCODONE-ACETAMINOPHEN 7.5-325 MG PO TABS: 1.0000 | ORAL_TABLET | ORAL | 0 refills | Status: AC | PRN

## 2017-06-21 MED ORDER — HYDROMORPHONE HCL 1 MG/ML IJ SOLN
1.0000 mg | Freq: Once | INTRAMUSCULAR | Status: AC
  Administered 2017-06-21: 1 mg via INTRAVENOUS
  Filled 2017-06-21: qty 1

## 2017-06-21 MED ORDER — OXYCODONE-ACETAMINOPHEN 5-325 MG PO TABS
2.0000 | ORAL_TABLET | Freq: Once | ORAL | Status: AC
  Administered 2017-06-21: 2 via ORAL
  Filled 2017-06-21: qty 2

## 2017-06-21 MED ORDER — ONDANSETRON HCL 4 MG/2ML IJ SOLN
4.0000 mg | Freq: Once | INTRAMUSCULAR | Status: AC
  Administered 2017-06-21: 4 mg via INTRAVENOUS
  Filled 2017-06-21: qty 2

## 2017-06-21 MED ORDER — ONDANSETRON HCL 4 MG PO TABS: 4.0000 mg | ORAL_TABLET | Freq: Every day | ORAL | 0 refills | Status: AC | PRN

## 2017-06-21 MED ORDER — TAMSULOSIN HCL 0.4 MG PO CAPS: 0.4000 mg | ORAL_CAPSULE | Freq: Every day | ORAL | 0 refills | Status: AC

## 2017-06-21 NOTE — Discharge Instructions (Signed)
Please follow up with urology regarding your kidney stones as an outpatient. They should call you to arrange follow up. You can contact them at the number listed on this discharge paperwork if you do not hear from them within 1-2 weeks.   Please follow up with your primary care physician regarding your ED visit, diagnosis of kidney stones, and kidney function.

## 2017-06-21 NOTE — ED Provider Notes (Signed)
[facility] DEPT Provider Note   CSN: [id number] Arrival date & time: 06/21/17  0710   History   Chief Complaint Chief Complaint  Patient presents with  . Flank Pain   HPI  [name] is a 56 y.o. male with a PMH of UC and T2DM who presented to the ED with a two day history of worsening L sided flank pain. The patient states that he first noticed this pain on Thursday when he urinated. He noticed some pain and burning with urination, along with a need to strain during urination. He also noticed a small amount of blood and a small kidney stone in the toilet bowl at this time. He reported significant relief in his L flank pain after this episode. The pain then returned later that day and has progressively worsened. The pain is currently rated as a 10/10. It is described as a sharp, stabbing pain that starts on his L lower back and radiates to his abdominal LLQ. The pain comes and goes in waves. It is worse with urination and some movements. He has been taking OTC medicine with some relief at home, but decided to come to the ED because his pain has become intolerable. His pain is associated with nausea and decreased appetite. Denies chest pain, SOB, diarrhea, constipation, and blood in stool.   The history is provided by the patient. No language interpreter was used.   PMH: Past Medical History:  Diagnosis Date  . Asthma   . Colitis, ulcerative chronic ([place])   . Diabetes mellitus without complication ([place])    There are no active problems to display for this patient.  PSH: Past Surgical History:  Procedure Laterality Date  . FLEXIBLE SIGMOIDOSCOPY N/A 01/14/2014   Procedure: FLEXIBLE SIGMOIDOSCOPY;  Surgeon: Wonda Horner, MD;  Location: [facility];  Service: Endoscopy;  Laterality: N/A;  . HERNIA REPAIR    . UMBILICAL HERNIA REPAIR    . Undescended testicle Surgery      Home Medications    Prior to Admission medications   Medication Sig Start Date End Date Taking?  Authorizing Provider  acetaminophen (TYLENOL) 500 MG tablet Take 1,000 mg by mouth 2 (two) times daily.   Yes [provider]  albuterol (PROVENTIL HFA;VENTOLIN HFA) 108 (90 BASE) MCG/ACT inhaler Inhale 2 puffs into the lungs every 6 (six) hours as needed for wheezing or shortness of breath.   Yes [provider]  bisoprolol-hydrochlorothiazide (ZIAC) 5-6.25 MG tablet Take 1 tablet by mouth daily. 05/14/17  Yes [provider]  blood glucose meter kit and supplies KIT Dispense based on patient and insurance preference. Use up to four times daily as directed. (FOR ICD-9 250.00, 250.01). 01/08/17  Yes Irick, Caren Macadam, MD  glimepiride (AMARYL) 1 MG tablet Take 1 tablet (1 mg total) by mouth daily with breakfast. 01/08/17 06/21/17 Yes Irick, Caren Macadam, MD  Glucosamine HCl (GLUCOSAMINE PO) Take 1 capsule by mouth daily.   Yes [provider]  mesalamine (LIALDA) 1.2 g EC tablet Take 1.2 g by mouth 2 (two) times daily. 06/09/17  Yes [provider]  metFORMIN (GLUCOPHAGE-XR) 500 MG 24 hr tablet Take 500 mg by mouth 2 (two) times daily. 06/09/17  Yes [provider]  predniSONE (DELTASONE) 5 MG tablet Take 10 mg by mouth every other day.    Yes [provider]  ondansetron (ZOFRAN) 4 MG tablet Take 1 tablet (4 mg total) by mouth daily as needed for nausea or vomiting. 06/21/17 06/21/18  Hosam Mcfetridge,  Larena Glassman, MD  oxyCODONE-acetaminophen (PERCOCET) 7.5-325 MG tablet Take 1 tablet by mouth every 4 (four) hours as needed for severe pain. 06/21/17 06/21/18  Thomasene Ripple, MD  tamsulosin (FLOMAX) 0.4 MG CAPS capsule Take 1 capsule (0.4 mg total) by mouth daily. 06/21/17   Thomasene Ripple, MD   Family History No family history on file.  Social History Social History  Substance Use Topics  . Smoking status: Never Smoker  . Smokeless tobacco: Never Used  . Alcohol use 1.2 oz/week    2 Cans of beer per week     Comment: Rarely    Allergies     Remicade [infliximab]  Review of Systems Review of Systems  Constitutional: Positive for appetite change. Negative for chills, diaphoresis and fever.  Cardiovascular: Negative for chest pain and leg swelling.  Gastrointestinal: Positive for abdominal distention, abdominal pain (LLQ) and nausea. Negative for blood in stool, constipation, diarrhea and vomiting.  Genitourinary: Positive for difficulty urinating, dysuria, flank pain, frequency and hematuria (as per HPI).  Neurological: Negative for headaches.   Physical Exam Updated Vital Signs BP (!) 157/93   Pulse (!) 57   Temp 98 F (36.7 C) (Oral)   Resp 18   Ht _0  (1.803 m)   Wt 108.9 kg (240 lb)   SpO2 94%   BMI 33.47 kg/m   Physical Exam  Constitutional:  Patient laying uncomfortably in bed in no acute distress.   Cardiovascular: Normal rate and regular rhythm.  Exam reveals no friction rub.   No murmur heard. Pulmonary/Chest: Effort normal. No respiratory distress. He has no wheezes.  No crackles appreciated  Musculoskeletal: He exhibits no edema (of bilateral lower extremities) or tenderness (of bilateral lower extremities).  CVA tenderness on L flank  Skin: Skin is warm and dry. No rash noted.  Patient has chronic areas of hyperpigmentation of bilateral lower extremities without evidence of overlying skin ulceration or dryness. Patient reports no change in color from baseline.   ED Treatments / Results  Labs (all labs ordered are listed, but only abnormal results are displayed) Labs Reviewed  BASIC METABOLIC PANEL - Abnormal; Notable for the following:       Result Value   Creatinine, Ser 1.87 (*)    GFR calc non Af Amer 38 (*)    GFR calc Af Amer 44 (*)    All other components within normal limits  URINALYSIS, ROUTINE W REFLEX MICROSCOPIC    EKG  EKG Interpretation None       Radiology Ct Renal Stone Study  Result Date: 06/21/2017 CLINICAL DATA:  Left flank pain.  History of nephrolithiasis. EXAM:  CT ABDOMEN AND PELVIS WITHOUT CONTRAST TECHNIQUE: Multidetector CT imaging of the abdomen and pelvis was performed following the standard protocol without IV contrast. COMPARISON:  04/18/2014. FINDINGS: Lower chest: Minimal bibasilar atelectasis. Hepatobiliary: Mildly nodular liver contours with a prominent lateral segment left lobe and caudate lobe. The right lobe is relatively small. Normal appearing gallbladder. Pancreas: Unremarkable. No pancreatic ductal dilatation or surrounding inflammatory changes. Spleen: The spleen is borderline enlarged with a mild interval decrease in size. Adrenals/Urinary Tract: Multiple bilateral renal cysts are again demonstrated. There are 2 small calculi in the lower pole of the right kidney, the larger measuring 6 mm in maximum diameter. There is also a tiny calculus in the mid to upper right kidney. There is a small amount of calcification associated with left renal cysts. Also demonstrated is a 5 mm calculus at the left ureteropelvic junction with mild  dilatation of the left renal collecting system. Otherwise, no ureteral or bladder calculi are seen. Normal appearing adrenal glands. Stomach/Bowel: Stomach is within normal limits. Appendix appears normal. No evidence of bowel wall thickening, distention, or inflammatory changes. Vascular/Lymphatic: Atheromatous arterial calcifications without aneurysm. No enlarged lymph nodes. Reproductive: Prostate is unremarkable. Other: Small left inguinal hernia containing fat. Musculoskeletal: Lumbar and lower thoracic spine degenerative changes. IMPRESSION: 1. 5 mm left UPJ calculus causing mild left hydronephrosis. 2. Small, nonobstructing right renal calculi. 3. Stable changes of cirrhosis of the liver. 4. Borderline splenomegaly with a mild decrease in size. Electronically Signed   By: Claudie Revering M.D.   On: 06/21/2017 09:32    Procedures Procedures (including critical care time)  Medications Ordered in ED Medications    HYDROmorphone (DILAUDID) injection 1 mg (1 mg Intravenous Given 06/21/17 0814)  HYDROmorphone (DILAUDID) injection 1 mg (1 mg Intravenous Given 06/21/17 0934)  ondansetron (ZOFRAN) injection 4 mg (4 mg Intravenous Given 06/21/17 1041)  HYDROmorphone (DILAUDID) injection 1 mg (1 mg Intravenous Given 06/21/17 1041)  oxyCODONE-acetaminophen (PERCOCET/ROXICET) 5-325 MG per tablet 2 tablet (2 tablets Oral Given 06/21/17 1134)    Initial Impression / Assessment and Plan / ED Course  I have reviewed the triage vital signs and the nursing notes.  Pertinent labs & imaging results that were available during my care of the patient were reviewed by me and considered in my medical decision making (see chart for details).  The patient's recent history of kidney stone and current symptoms are concerning for recurrent kidney stone. The patient has CVA tenderness on exam and reports symptoms of urinary obstruction. Plan to obtain urinalysis to look for evidence of infection. Will also obtain renal CT to look for stone/hydronephrosis. Patient will be given dilaudid for pain management.  Less of a concern for UC flare, as patient has has been taking medication, including mesalamine and prednisone, regularly and denies pain with bowel movements and recent blood in bowel movements.    Final Clinical Impressions(s) / ED Diagnoses   Final diagnoses:  Nephrolithiasis   Patient had signs of mild, left hydronephrosis with a 5 mm stone at L UPJ on CT. He also had multiple non-obstructing stones identified within R kidney, the largest of which is 6 mm in diameter. He has bilateral, stable renal cysts noted on CT, which is interesting given his reported family history of CKD and renal failure resulting in dialysis. His Cr is elevated to 1.87 relative to last record of 1.36 in 12/2016 ED visit, this may be 2/2 decreased PO intake. His urinalysis did not show signs of infection, no hematuria, or proteinuria. The patient was able to  urinate without difficulty in room and has been managing pain with oral medications. The patient was encouraged to continue increased fluid intake as an outpatient to help with stone passage.  Spoke with Urologist on call, Dr. Burman Nieves, and he agreed that patient can be discharged with close urology follow up. He will be given Flomax to assist with stone passage, ondansetron to assist with nausea, and percocet for pain management. The patient will be discharged with urology follow up.   New Prescriptions New Prescriptions   ONDANSETRON (ZOFRAN) 4 MG TABLET    Take 1 tablet (4 mg total) by mouth daily as needed for nausea or vomiting.   OXYCODONE-ACETAMINOPHEN (PERCOCET) 7.5-325 MG TABLET    Take 1 tablet by mouth every 4 (four) hours as needed for severe pain.   TAMSULOSIN (FLOMAX) 0.4 MG CAPS  CAPSULE    Take 1 capsule (0.4 mg total) by mouth daily.     Thomasene Ripple, MD 06/21/17 [phone]

## 2017-06-21 NOTE — ED Notes (Signed)
Pt returned from ct

## 2017-06-21 NOTE — ED Triage Notes (Signed)
Pt c/o L flank pain, hx of kidney stones

## 2017-06-21 NOTE — Telephone Encounter (Signed)
Oxycodone changed from q$ hours to q6 hours [facility]Marland Kitchen..Marland Kitchen

## 2017-06-21 NOTE — ED Notes (Signed)
Pt to ct 

## 2017-06-21 NOTE — ED Provider Notes (Signed)
I saw and evaluated the patient, reviewed the resident's note and I agree with the findings and plan.   EKG Interpretation None     56 year old male presents with left-sided flank pain which is been colicky in nature. Similar to his prior kidney stones. On exam, he does have left CVA tenderness but denies any recent fever or urinary symptoms. Will check renal CT and medicated for pain.   Lorre NickAllen, Karol Skarzynski, MD 06/21/17 [phone]

## 2017-06-22 ENCOUNTER — Observation Stay (HOSPITAL_COMMUNITY)
Admission: EM | Admit: 2017-06-22 | Discharge: 2017-06-23 | Disposition: A | Discharge status: Home / Self Care | Payer: BLUE CROSS/BLUE SHIELD | Attending: Urology | Admitting: Urology

## 2017-06-22 ENCOUNTER — Encounter (HOSPITAL_COMMUNITY): Payer: Self-pay | Admitting: Emergency Medicine

## 2017-06-22 DIAGNOSIS — Z888 Allergy status to other drugs, medicaments and biological substances status: Secondary | ICD-10-CM | POA: Diagnosis not present

## 2017-06-22 DIAGNOSIS — N132 Hydronephrosis with renal and ureteral calculous obstruction: Secondary | ICD-10-CM | POA: Diagnosis not present

## 2017-06-22 DIAGNOSIS — Z79899 Other long term (current) drug therapy: Secondary | ICD-10-CM | POA: Diagnosis not present

## 2017-06-22 DIAGNOSIS — J45909 Unspecified asthma, uncomplicated: Secondary | ICD-10-CM | POA: Insufficient documentation

## 2017-06-22 DIAGNOSIS — N201 Calculus of ureter: Secondary | ICD-10-CM

## 2017-06-22 DIAGNOSIS — E119 Type 2 diabetes mellitus without complications: Secondary | ICD-10-CM | POA: Insufficient documentation

## 2017-06-22 DIAGNOSIS — Z7984 Long term (current) use of oral hypoglycemic drugs: Secondary | ICD-10-CM | POA: Diagnosis not present

## 2017-06-22 DIAGNOSIS — Z7952 Long term (current) use of systemic steroids: Secondary | ICD-10-CM | POA: Diagnosis not present

## 2017-06-22 DIAGNOSIS — N200 Calculus of kidney: Secondary | ICD-10-CM

## 2017-06-22 LAB — CBC WITH DIFFERENTIAL/PLATELET
Basophils Absolute: 0 10*3/uL (ref 0.0–0.1)
Basophils Relative: 0 %
Eosinophils Absolute: 0.4 10*3/uL (ref 0.0–0.7)
Eosinophils Relative: 5 %
HEMATOCRIT: 39.3 % (ref 39.0–52.0)
HEMOGLOBIN: 13.8 g/dL (ref 13.0–17.0)
Lymphocytes Relative: 20 %
Lymphs Abs: 1.8 10*3/uL (ref 0.7–4.0)
MCH: 30.8 pg (ref 26.0–34.0)
MCHC: 35.1 g/dL (ref 30.0–36.0)
MCV: 87.7 fL (ref 78.0–100.0)
MONOS PCT: 13 %
Monocytes Absolute: 1.1 10*3/uL — ABNORMAL HIGH (ref 0.1–1.0)
NEUTROS ABS: 5.5 10*3/uL (ref 1.7–7.7)
NEUTROS PCT: 62 %
Platelets: 191 10*3/uL (ref 150–400)
RBC: 4.48 MIL/uL (ref 4.22–5.81)
RDW: 12.8 % (ref 11.5–15.5)
WBC: 8.8 10*3/uL (ref 4.0–10.5)

## 2017-06-22 LAB — URINALYSIS, ROUTINE W REFLEX MICROSCOPIC
BILIRUBIN URINE: NEGATIVE
Glucose, UA: NEGATIVE mg/dL
HGB URINE DIPSTICK: NEGATIVE
Ketones, ur: NEGATIVE mg/dL
Leukocytes, UA: NEGATIVE
NITRITE: NEGATIVE
PH: 5 (ref 5.0–8.0)
Protein, ur: NEGATIVE mg/dL
SPECIFIC GRAVITY, URINE: 1.015 (ref 1.005–1.030)

## 2017-06-22 MED ORDER — MORPHINE SULFATE (PF) 4 MG/ML IV SOLN
4.0000 mg | Freq: Once | INTRAVENOUS | Status: AC
  Administered 2017-06-22: 4 mg via INTRAVENOUS
  Filled 2017-06-22: qty 1

## 2017-06-22 MED ORDER — ONDANSETRON HCL 4 MG/2ML IJ SOLN
4.0000 mg | Freq: Once | INTRAMUSCULAR | Status: AC
  Administered 2017-06-22: 4 mg via INTRAVENOUS
  Filled 2017-06-22: qty 2

## 2017-06-22 NOTE — ED Triage Notes (Signed)
Pt from home with c/o persistent and uncontrollable left flank pain.  [facility] and was diagnosed with kidney stones, was subsequently discharged home with oxycodone and was advised to come back to ED for worsening pain.  Pt reports he is able to void " but very little".  Pt also c/o nausea and dry heaves.

## 2017-06-22 NOTE — ED Provider Notes (Signed)
[facility] DEPT Provider Note   CSN: [id number] Arrival date & time: 06/22/17  2054     History   Chief Complaint Chief Complaint  Patient presents with  . Flank Pain    HPI [name] is a 57 y.o. male history of ulcerative colitis, diabetes, asthma and hypertension presenting with left flank pain which has continued since yesterday after being discharge with diagnosis nephrolithiasis at [facility]. He reports that he has been drinking plenty of fluids but has not been able to urinate very much. Pain continues and his had nausea with dry heaving but no vomiting. Has taken his medications as prescribed last dose was 3 hours ago. He also reports that he hasn't had a bowel movement in 2 days, has been passing some gas. Denies fever, chills, vomiting, diarrhea, or other symptoms.   HPI  Past Medical History:  Diagnosis Date  . Asthma   . Colitis, ulcerative chronic ([place])   . Diabetes mellitus without complication ([place])     There are no active problems to display for this patient.   Past Surgical History:  Procedure Laterality Date  . FLEXIBLE SIGMOIDOSCOPY N/A 01/14/2014   Procedure: FLEXIBLE SIGMOIDOSCOPY;  Surgeon: Wonda Horner, MD;  Location: [facility];  Service: Endoscopy;  Laterality: N/A;  . HERNIA REPAIR    . UMBILICAL HERNIA REPAIR    . Undescended testicle Surgery         Home Medications    Prior to Admission medications   Medication Sig Start Date End Date Taking? Authorizing Provider  acetaminophen (TYLENOL) 500 MG tablet Take 1,000 mg by mouth 2 (two) times daily.    [provider]  albuterol (PROVENTIL HFA;VENTOLIN HFA) 108 (90 BASE) MCG/ACT inhaler Inhale 2 puffs into the lungs every 6 (six) hours as needed for wheezing or shortness of breath.    [provider]  bisoprolol-hydrochlorothiazide (ZIAC) 5-6.25 MG tablet Take 1 tablet by mouth daily. 05/14/17   [provider]  blood glucose meter kit and supplies KIT Dispense  based on patient and insurance preference. Use up to four times daily as directed. (FOR ICD-9 250.00, 250.01). 01/08/17   Zipporah Plants, MD  glimepiride (AMARYL) 1 MG tablet Take 1 tablet (1 mg total) by mouth daily with breakfast. 01/08/17 06/21/17  Zipporah Plants, MD  Glucosamine HCl (GLUCOSAMINE PO) Take 1 capsule by mouth daily.    [provider]  mesalamine (LIALDA) 1.2 g EC tablet Take 1.2 g by mouth 2 (two) times daily. 06/09/17   [provider]  metFORMIN (GLUCOPHAGE-XR) 500 MG 24 hr tablet Take 500 mg by mouth 2 (two) times daily. 06/09/17   [provider]  ondansetron (ZOFRAN) 4 MG tablet Take 1 tablet (4 mg total) by mouth daily as needed for nausea or vomiting. 06/21/17 06/21/18  Thomasene Ripple, MD  oxyCODONE-acetaminophen (PERCOCET) 7.5-325 MG tablet Take 1 tablet by mouth every 4 (four) hours as needed for severe pain. 06/21/17 06/21/18  Thomasene Ripple, MD  predniSONE (DELTASONE) 5 MG tablet Take 10 mg by mouth every other day.     [provider]  tamsulosin (FLOMAX) 0.4 MG CAPS capsule Take 1 capsule (0.4 mg total) by mouth daily. 06/21/17   Thomasene Ripple, MD    Family History History reviewed. No pertinent family history.  Social History Social History  Substance Use Topics  . Smoking status: Never Smoker  . Smokeless tobacco: Never Used  . Alcohol use 1.2 oz/week    2 Cans of beer  per week     Comment: Rarely     Allergies   Remicade [infliximab]   Review of Systems Review of Systems  Constitutional: Negative for chills and fever.  HENT: Negative for ear pain and sore throat.   Respiratory: Negative for cough, shortness of breath, wheezing and stridor.   Cardiovascular: Negative for chest pain and palpitations.  Gastrointestinal: Positive for nausea. Negative for abdominal distention, abdominal pain and vomiting.  Genitourinary: Positive for decreased urine volume and flank pain. Negative for discharge, dysuria,  frequency, hematuria, penile pain, penile swelling, scrotal swelling, testicular pain and urgency.  Musculoskeletal: Negative for arthralgias, back pain, myalgias, neck pain and neck stiffness.  Skin: Negative for color change, pallor and rash.  Neurological: Negative for dizziness, seizures, syncope, weakness, light-headedness and headaches.     Physical Exam Updated Vital Signs BP (!) 176/93   Pulse 62   Temp 98.3 F (36.8 C) (Oral)   Resp 18   Ht 5\' 11"  (1.803 m)   Wt 111.6 kg (246 lb)   SpO2 95%   BMI 34.31 kg/m   Physical Exam  Constitutional: He appears well-developed and well-nourished. No distress.  Afebrile, nontoxic-appearing, lying in bed in mild discomfort.  HENT:  Head: Normocephalic and atraumatic.  Eyes: Conjunctivae and EOM are normal.  Neck: Normal range of motion.  Cardiovascular: Normal rate, regular rhythm and normal heart sounds.   No murmur heard. Pulmonary/Chest: Effort normal and breath sounds normal. No respiratory distress. He has no wheezes. He has no rales.  Abdominal: Soft. He exhibits no mass. There is tenderness. There is no rebound and no guarding.  Tenderness reproducible in the left flank left lower back. No abdominal tenderness palpation. No CVA tenderness  Musculoskeletal: Normal range of motion. He exhibits no edema or deformity.  Neurological: He is alert.  Skin: Skin is warm and dry. No rash noted. He is not diaphoretic. No erythema. No pallor.  Psychiatric: He has a normal mood and affect.  Nursing note and vitals reviewed.    ED Treatments / Results  Labs (all labs ordered are listed, but only abnormal results are displayed) Labs Reviewed  BASIC METABOLIC PANEL - Abnormal; Notable for the following:       Result Value   Sodium 134 (*)    Chloride 99 (*)    Glucose, Bld 105 (*)    BUN 21 (*)    Creatinine, Ser 1.97 (*)    GFR calc non Af Amer 36 (*)    GFR calc Af Amer 42 (*)    All other components within normal limits    CBC WITH DIFFERENTIAL/PLATELET - Abnormal; Notable for the following:    Monocytes Absolute 1.1 (*)    All other components within normal limits  URINALYSIS, ROUTINE W REFLEX MICROSCOPIC    EKG  EKG Interpretation None       Radiology Ct Renal Stone Study  Result Date: 06/21/2017 CLINICAL DATA:  Left flank pain.  History of nephrolithiasis. EXAM: CT ABDOMEN AND PELVIS WITHOUT CONTRAST TECHNIQUE: Multidetector CT imaging of the abdomen and pelvis was performed following the standard protocol without IV contrast. COMPARISON:  04/18/2014. FINDINGS: Lower chest: Minimal bibasilar atelectasis. Hepatobiliary: Mildly nodular liver contours with a prominent lateral segment left lobe and caudate lobe. The right lobe is relatively small. Normal appearing gallbladder. Pancreas: Unremarkable. No pancreatic ductal dilatation or surrounding inflammatory changes. Spleen: The spleen is borderline enlarged with a mild interval decrease in size. Adrenals/Urinary Tract: Multiple bilateral renal cysts are again  demonstrated. There are 2 small calculi in the lower pole of the right kidney, the larger measuring 6 mm in maximum diameter. There is also a tiny calculus in the mid to upper right kidney. There is a small amount of calcification associated with left renal cysts. Also demonstrated is a 5 mm calculus at the left ureteropelvic junction with mild dilatation of the left renal collecting system. Otherwise, no ureteral or bladder calculi are seen. Normal appearing adrenal glands. Stomach/Bowel: Stomach is within normal limits. Appendix appears normal. No evidence of bowel wall thickening, distention, or inflammatory changes. Vascular/Lymphatic: Atheromatous arterial calcifications without aneurysm. No enlarged lymph nodes. Reproductive: Prostate is unremarkable. Other: Small left inguinal hernia containing fat. Musculoskeletal: Lumbar and lower thoracic spine degenerative changes. IMPRESSION: 1. 5 mm left UPJ  calculus causing mild left hydronephrosis. 2. Small, nonobstructing right renal calculi. 3. Stable changes of cirrhosis of the liver. 4. Borderline splenomegaly with a mild decrease in size. Electronically Signed   By: Claudie Revering M.D.   On: 06/21/2017 09:32    Procedures Procedures (including critical care time)  Medications Ordered in ED Medications  ketorolac (TORADOL) 30 MG/ML injection 30 mg (not administered)  ondansetron (ZOFRAN) injection 4 mg (4 mg Intravenous Given 06/22/17 2313)  morphine 4 MG/ML injection 4 mg (4 mg Intravenous Given 06/22/17 2313)  morphine 4 MG/ML injection 4 mg (4 mg Intravenous Given 06/22/17 2355)  HYDROmorphone (DILAUDID) injection 1 mg (1 mg Intravenous Given 06/23/17 0058)  HYDROmorphone (DILAUDID) injection 1 mg (1 mg Intravenous Given 06/23/17 0321)     Initial Impression / Assessment and Plan / ED Course  I have reviewed the triage vital signs and the nursing notes.  Pertinent labs & imaging results that were available during my care of the patient were reviewed by me and considered in my medical decision making (see chart for details).      Patient presenting with poorly controlled pain left flank after diagnosed nephrolithiasis yesterday. 5 mm obstructing stone with mild hydronephrosis on CT renal. She was reporting decreased urination. Bladder scan negative for retention. Labs otherwise unremarkable without signs of infection. Urine is clear and no white count.  Patient's pain was difficult to manage while in the emergency department. Patient reported some improvement after Dilaudid.  Pain returned and he reported severe 10/10 pain. Improved after second dose of dilaudid.  3:30 am--spoke to urology who recommends a 30 mg Toradol, and he will be admitting the patient. NPO  Final Clinical Impressions(s) / ED Diagnoses   Final diagnoses:  Nephrolithiasis    New Prescriptions New Prescriptions   No medications on file     Dossie Der 06/23/17 1017    Davonna Belling, MD 06/23/17 2356

## 2017-06-23 DIAGNOSIS — N201 Calculus of ureter: Secondary | ICD-10-CM | POA: Diagnosis present

## 2017-06-23 LAB — BASIC METABOLIC PANEL
Anion gap: 12 (ref 5–15)
BUN: 21 mg/dL — AB (ref 6–20)
CHLORIDE: 99 mmol/L — AB (ref 101–111)
CO2: 23 mmol/L (ref 22–32)
Calcium: 8.9 mg/dL (ref 8.9–10.3)
Creatinine, Ser: 1.97 mg/dL — ABNORMAL HIGH (ref 0.61–1.24)
GFR calc Af Amer: 42 mL/min — ABNORMAL LOW (ref >60)
GFR, EST NON AFRICAN AMERICAN: 36 mL/min — AB (ref >60)
GLUCOSE: 105 mg/dL — AB (ref 65–99)
POTASSIUM: 4.1 mmol/L (ref 3.5–5.1)
Sodium: 134 mmol/L — ABNORMAL LOW (ref 135–145)

## 2017-06-23 LAB — GLUCOSE, CAPILLARY
GLUCOSE-CAPILLARY: 84 mg/dL (ref 65–99)
Glucose-Capillary: 91 mg/dL (ref 65–99)

## 2017-06-23 MED ORDER — DIPHENHYDRAMINE HCL 12.5 MG/5ML PO ELIX: 12.5000 mg | ORAL_SOLUTION | Freq: Four times a day (QID) | ORAL | Status: DC | PRN

## 2017-06-23 MED ORDER — HYDROMORPHONE HCL 1 MG/ML IJ SOLN
1.0000 mg | Freq: Once | INTRAMUSCULAR | Status: AC
  Administered 2017-06-23: 1 mg via INTRAVENOUS
  Filled 2017-06-23: qty 1

## 2017-06-23 MED ORDER — ZOLPIDEM TARTRATE 5 MG PO TABS: 5.0000 mg | ORAL_TABLET | Freq: Every evening | ORAL | Status: DC | PRN

## 2017-06-23 MED ORDER — SODIUM CHLORIDE 0.9 % IV SOLN
INTRAVENOUS | Status: DC
  Administered 2017-06-23 (×2): via INTRAVENOUS

## 2017-06-23 MED ORDER — KETOROLAC TROMETHAMINE 30 MG/ML IJ SOLN
30.0000 mg | Freq: Once | INTRAMUSCULAR | Status: AC
  Administered 2017-06-23: 30 mg via INTRAVENOUS
  Filled 2017-06-23: qty 1

## 2017-06-23 MED ORDER — KETOROLAC TROMETHAMINE 30 MG/ML IJ SOLN
30.0000 mg | Freq: Three times a day (TID) | INTRAMUSCULAR | Status: DC
  Administered 2017-06-23: 30 mg via INTRAVENOUS
  Filled 2017-06-23: qty 1

## 2017-06-23 MED ORDER — DIPHENHYDRAMINE HCL 50 MG/ML IJ SOLN: 12.5000 mg | Freq: Four times a day (QID) | INTRAMUSCULAR | Status: DC | PRN

## 2017-06-23 MED ORDER — OXYCODONE HCL 5 MG PO TABS
15.0000 mg | ORAL_TABLET | ORAL | Status: DC | PRN
Start: 2017-06-23 — End: 2017-06-23

## 2017-06-23 MED ORDER — INSULIN ASPART 100 UNIT/ML ~~LOC~~ SOLN: 0.0000 [IU] | Freq: Three times a day (TID) | SUBCUTANEOUS | Status: DC

## 2017-06-23 MED ORDER — ONDANSETRON HCL 4 MG/2ML IJ SOLN: 4.0000 mg | INTRAMUSCULAR | Status: DC | PRN

## 2017-06-23 MED ORDER — HYDROMORPHONE HCL 1 MG/ML IJ SOLN: 0.5000 mg | INTRAMUSCULAR | Status: DC | PRN

## 2017-06-23 NOTE — H&P (Signed)
Urology Admission H&P  Chief Complaint: left flank pain  History of Present Illness: [name] is a 56yo with a hx of DMII and nephrolithiasis who presented to [facility] ER with a 2 week history of progressively worsening left flank pain. His pain was controlled in the ER and he was discharged home. He then presented to [facility] ER this morning with worsening left flank pain. The pain is sharp, constant, severe, nonradiating with associated nausea and vomiting. His pain was unable to be controlled in the ER and Urology was consulted for admission. This is his 4th stone event and he has not required prior surgery. He denies any LUTS. No exacerbating/alleviaitng events. No other associated symptoms. CT shows a 73m UPJ calculus with mild hydronephrosis  Past Medical History:  Diagnosis Date  . Asthma   . [place]   . Diabetes mellitus without complication ([facility]    Past Surgical History:  Procedure Laterality Date  . FLEXIBLE SIGMOIDOSCOPY N/A 01/14/2014   Procedure: FLEXIBLE SIGMOIDOSCOPY;  Surgeon: SWonda Horner MD;  Location: [facility];  Service: Endoscopy;  Laterality: N/A;  . HERNIA REPAIR    . UMBILICAL HERNIA REPAIR    . Undescended testicle Surgery      Home Medications:  Prescriptions Prior to Admission  Medication Sig Dispense Refill Last Dose  . acetaminophen (TYLENOL) 500 MG tablet Take 1,000 mg by mouth 2 (two) times daily.   06/21/2017 at Unknown time  . albuterol (PROVENTIL HFA;VENTOLIN HFA) 108 (90 BASE) MCG/ACT inhaler Inhale 2 puffs into the lungs every 6 (six) hours as needed for wheezing or shortness of breath.   rescue at recue  . bisoprolol-hydrochlorothiazide (ZIAC) 5-6.25 MG tablet Take 1 tablet by mouth daily.  5 06/20/2017 at Unknown time  . blood glucose meter kit and supplies KIT Dispense based on patient and insurance preference. Use up to four times daily as directed. (FOR ICD-9 250.00, 250.01). 1 each 0 06/20/2017 at Unknown time  .  glimepiride (AMARYL) 1 MG tablet Take 1 tablet (1 mg total) by mouth daily with breakfast. 15 tablet 0 06/20/2017 at Unknown time  . Glucosamine HCl (GLUCOSAMINE PO) Take 1 capsule by mouth daily.   06/20/2017 at Unknown time  . mesalamine (LIALDA) 1.2 g EC tablet Take 1.2 g by mouth 2 (two) times daily.  6 06/20/2017 at Unknown time  . metFORMIN (GLUCOPHAGE-XR) 500 MG 24 hr tablet Take 500 mg by mouth 2 (two) times daily.  2 06/20/2017 at Unknown time  . ondansetron (ZOFRAN) 4 MG tablet Take 1 tablet (4 mg total) by mouth daily as needed for nausea or vomiting. 30 tablet 0   . oxyCODONE-acetaminophen (PERCOCET) 7.5-325 MG tablet Take 1 tablet by mouth every 4 (four) hours as needed for severe pain. 20 tablet 0   . predniSONE (DELTASONE) 5 MG tablet Take 10 mg by mouth every other day.    06/20/2017 at Unknown time  . tamsulosin (FLOMAX) 0.4 MG CAPS capsule Take 1 capsule (0.4 mg total) by mouth daily. 30 capsule 0    Allergies:  Allergies  Allergen Reactions  . Remicade [Infliximab] Rash    History reviewed. No pertinent family history. Social History:  reports that he has never smoked. He has never used smokeless tobacco. He reports that he drinks about 1.2 oz of alcohol per week . He reports that he does not use drugs.  Review of Systems  Gastrointestinal: Positive for nausea.  Genitourinary: Positive for flank pain.  All other systems  reviewed and are negative.   Physical Exam:  Vital signs in last 24 hours: Temp:  [98.1 F (36.7 C)-99.2 F (37.3 C)] 98.1 F (36.7 C) (09/03 1255) Pulse Rate:  [59-75] 59 (09/03 1255) Resp:  [16-20] 18 (09/03 1255) BP: (153-179)/(72-95) 153/78 (09/03 1255) SpO2:  [93 %-98 %] 96 % (09/03 1255) Weight:  [109.9 kg (242 lb 3.2 oz)-111.7 kg (246 lb 4.8 oz)] 109.9 kg (242 lb 3.2 oz) (09/03 0453) Physical Exam  Constitutional: He is oriented to person, place, and time. He appears well-developed and well-nourished.  HENT:  Head: Normocephalic and  atraumatic.  Eyes: Pupils are equal, round, and reactive to light. EOM are normal.  Neck: Normal range of motion. No thyromegaly present.  Cardiovascular: Normal rate and regular rhythm.   Respiratory: Effort normal. No respiratory distress.  GI: Soft. He exhibits no distension. Hernia confirmed negative in the right inguinal area and confirmed negative in the left inguinal area.  Genitourinary: Testes normal and penis normal. Right testis shows no mass, no swelling and no tenderness. Right testis is descended. Cremasteric reflex is not absent on the right side. Left testis shows no mass, no swelling and no tenderness. Left testis is descended. Cremasteric reflex is not absent on the left side. No hypospadias.  Musculoskeletal: Normal range of motion. He exhibits no edema.  Lymphadenopathy:       Right: No inguinal adenopathy present.       Left: No inguinal adenopathy present.  Neurological: He is alert and oriented to person, place, and time.  Skin: Skin is warm and dry.  Psychiatric: He has a normal mood and affect. His behavior is normal. Judgment and thought content normal.    Laboratory Data:  Results for orders placed or performed during the hospital encounter of 06/22/17 (from the past 24 hour(s))  Basic metabolic panel     Status: Abnormal   Collection Time: 06/22/17 10:58 PM  Result Value Ref Range   Sodium 134 (L) 135 - 145 mmol/L   Potassium 4.1 3.5 - 5.1 mmol/L   Chloride 99 (L) 101 - 111 mmol/L   CO2 23 22 - 32 mmol/L   Glucose, Bld 105 (H) 65 - 99 mg/dL   BUN 21 (H) 6 - 20 mg/dL   Creatinine, Ser 1.97 (H) 0.61 - 1.24 mg/dL   Calcium 8.9 8.9 - 10.3 mg/dL   GFR calc non Af Amer 36 (L) >60 mL/min   GFR calc Af Amer 42 (L) >60 mL/min   Anion gap 12 5 - 15  CBC with Differential     Status: Abnormal   Collection Time: 06/22/17 10:58 PM  Result Value Ref Range   WBC 8.8 4.0 - 10.5 K/uL   RBC 4.48 4.22 - 5.81 MIL/uL   Hemoglobin 13.8 13.0 - 17.0 g/dL   HCT 39.3 39.0 -  52.0 %   MCV 87.7 78.0 - 100.0 fL   MCH 30.8 26.0 - 34.0 pg   MCHC 35.1 30.0 - 36.0 g/dL   RDW 12.8 11.5 - 15.5 %   Platelets 191 150 - 400 K/uL   Neutrophils Relative % 62 %   Neutro Abs 5.5 1.7 - 7.7 K/uL   Lymphocytes Relative 20 %   Lymphs Abs 1.8 0.7 - 4.0 K/uL   Monocytes Relative 13 %   Monocytes Absolute 1.1 (H) 0.1 - 1.0 K/uL   Eosinophils Relative 5 %   Eosinophils Absolute 0.4 0.0 - 0.7 K/uL   Basophils Relative 0 %   Basophils  Absolute 0.0 0.0 - 0.1 K/uL  Urinalysis, Routine w reflex microscopic     Status: None   Collection Time: 06/22/17 10:59 PM  Result Value Ref Range   Color, Urine YELLOW YELLOW   APPearance CLEAR CLEAR   Specific Gravity, Urine 1.015 1.005 - 1.030   pH 5.0 5.0 - 8.0   Glucose, UA NEGATIVE NEGATIVE mg/dL   Hgb urine dipstick NEGATIVE NEGATIVE   Bilirubin Urine NEGATIVE NEGATIVE   Ketones, ur NEGATIVE NEGATIVE mg/dL   Protein, ur NEGATIVE NEGATIVE mg/dL   Nitrite NEGATIVE NEGATIVE   Leukocytes, UA NEGATIVE NEGATIVE  Glucose, capillary     Status: None   Collection Time: 06/23/17 10:45 AM  Result Value Ref Range   Glucose-Capillary 91 65 - 99 mg/dL   No results found for this or any previous visit (from the past 240 hour(s)). Creatinine:  Recent Labs  06/21/17 0755 06/22/17 2258  CREATININE 1.87* 1.97*   Baseline Creatinine: 1.0  Impression/Assessment:  56yo with left ureteral calculus   Plan:  1. Pt will be admitted for pain control and medical expulsive therapy. If he pain is unable to be controlled he will be taken to the OR for left ureteral stent placement  Nicolette Bang 06/23/2017, 4:54 PM

## 2017-06-23 NOTE — ED Notes (Signed)
[place] [facility], [phone] at [phone]

## 2017-06-23 NOTE — Discharge Instructions (Signed)

## 2017-06-24 LAB — HIV ANTIBODY (ROUTINE TESTING W REFLEX): HIV SCREEN 4TH GENERATION: NONREACTIVE

## 2017-06-28 NOTE — Discharge Summary (Signed)
Physician Discharge Summary  Patient ID: [name] MRN: [id number] DOB/AGE: Mar 13, 1961 56 y.o.  Admit date: 06/22/2017 Discharge date: 06/23/2017  Admission Diagnoses: Ureteral calculus, intractable pain Discharge Diagnoses:  Active Problems:   Ureteral calculus   Discharged Condition: good  Hospital Course: The patient was admitted with intractable flank pain. He was given diluadid and toradol which alleviated his pain. Once his pain was controlled he was discharged home on medical expulsive therapy.  Prior to discharge the pt was tolerating a regular diet, pain was controlled on PO pain meds, they were ambulating without difficulty, and they had normal bowel function.   Consults: None  Significant Diagnostic Studies: none  Treatments: analgesia: Dilaudid and toradol  Discharge Exam: Blood pressure (!) 153/78, pulse (!) 59, temperature 98.1 F (36.7 C), temperature source Oral, resp. rate 18, height '5\' 11"'$  (1.803 m), weight 109.9 kg (242 lb 3.2 oz), SpO2 96 %. General appearance: alert, cooperative and appears stated age Eyes: conjunctivae/corneas clear. PERRL, EOM's intact. Fundi benign. Nose: Nares normal. Septum midline. Mucosa normal. No drainage or sinus tenderness. Resp: clear to auscultation bilaterally Cardio: regular rate and rhythm, S1, S2 normal, no murmur, click, rub or gallop GI: soft, non-tender; bowel sounds normal; no masses,  no organomegaly Extremities: extremities normal, atraumatic, no cyanosis or edema Neurologic: Grossly normal  Disposition: 01-Home or Self Care  Discharge Instructions    Discharge patient    Complete by:  As directed    Discharge disposition:  01-Home or Self Care   Discharge patient date:  06/23/2017     Allergies as of 06/23/2017      Reactions   Remicade [infliximab] Rash      Medication List    TAKE these medications   acetaminophen 500 MG tablet Commonly known as:  TYLENOL Take 1,000 mg by mouth 2 (two) times daily.   albuterol 108 (90 Base) MCG/ACT inhaler Commonly known as:  PROVENTIL HFA;VENTOLIN HFA Inhale 2 puffs into the lungs every 6 (six) hours as needed for wheezing or shortness of breath.   bisoprolol-hydrochlorothiazide 5-6.25 MG tablet Commonly known as:  ZIAC Take 1 tablet by mouth daily.   blood glucose meter kit and supplies Kit Dispense based on patient and insurance preference. Use up to four times daily as directed. (FOR ICD-9 250.00, 250.01).   glimepiride 1 MG tablet Commonly known as:  AMARYL Take 1 tablet (1 mg total) by mouth daily with breakfast.   GLUCOSAMINE PO Take 1 capsule by mouth daily.   mesalamine 1.2 g EC tablet Commonly known as:  LIALDA Take 1.2 g by mouth 2 (two) times daily.   metFORMIN 500 MG 24 hr tablet Commonly known as:  GLUCOPHAGE-XR Take 500 mg by mouth 2 (two) times daily.   ondansetron 4 MG tablet Commonly known as:  ZOFRAN Take 1 tablet (4 mg total) by mouth daily as needed for nausea or vomiting.   oxyCODONE-acetaminophen 7.5-325 MG tablet Commonly known as:  PERCOCET Take 1 tablet by mouth every 4 (four) hours as needed for severe pain.   predniSONE 5 MG tablet Commonly known as:  DELTASONE Take 10 mg by mouth every other day.   tamsulosin 0.4 MG Caps capsule Commonly known as:  FLOMAX Take 1 capsule (0.4 mg total) by mouth daily.            Discharge Care Instructions        Start     Ordered   06/23/17 0000  Discharge patient    Question Answer  Comment  Discharge disposition 01-Home or Self Care   Discharge patient date 06/23/2017      06/23/17 7564     Follow-up Information    Jed Limerick, NP. Call on 06/27/2017.   Specialty:  Urology Contact information: [address] [place]  [address] [phone]           Signed: Nicolette Bang 06/28/2017, 4:16 PM

## 2017-07-30 ENCOUNTER — Other Ambulatory Visit: Payer: Self-pay | Admitting: Gastroenterology

## 2017-08-05 ENCOUNTER — Encounter (HOSPITAL_COMMUNITY): Payer: Self-pay

## 2017-08-05 ENCOUNTER — Ambulatory Visit (HOSPITAL_COMMUNITY): Admit: 2017-08-05 | Payer: BLUE CROSS/BLUE SHIELD | Admitting: Gastroenterology

## 2017-08-05 SURGERY — ESOPHAGOGASTRODUODENOSCOPY (EGD) WITH PROPOFOL
Anesthesia: Monitor Anesthesia Care

## 2018-10-21 DEATH — deceased

## 2019-05-05 IMAGING — CT CT RENAL STONE PROTOCOL
2 of 4 series · 16 of 46 positions shown, 18 images · non-contrast
Comparison: 04/18/2014.

CLINICAL DATA: Left flank pain.  History of nephrolithiasis.

EXAM:
CT ABDOMEN AND PELVIS WITHOUT CONTRAST
TECHNIQUE: Multidetector CT imaging of the abdomen and pelvis was performed
following the standard protocol without IV contrast.

[Series 3: renal stone 5.0 · axial · 0.97mm/px · z∈[+779,+1229]mm · 13 of 98 slices shown, 15 images]
[im 4/98  soft-tissue]
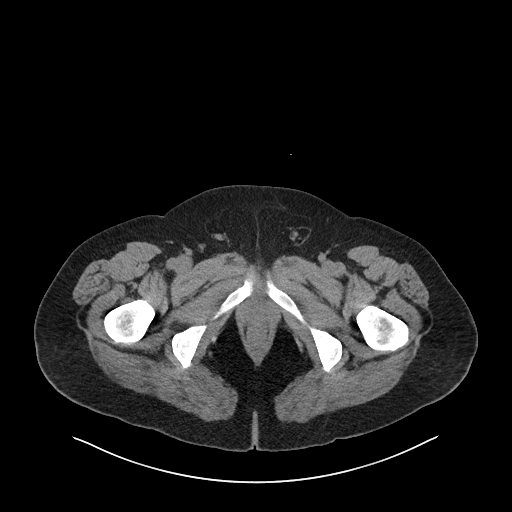
[im 4/98  bone]
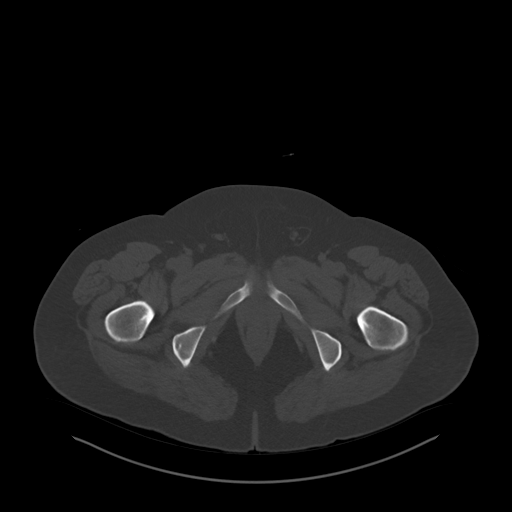
[im 12/98  soft-tissue]
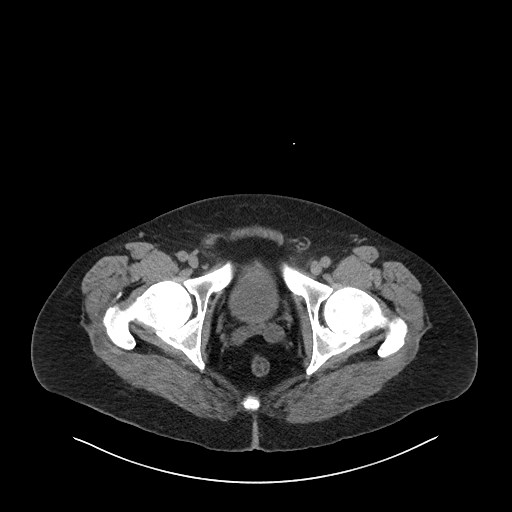
[im 20/98  soft-tissue]
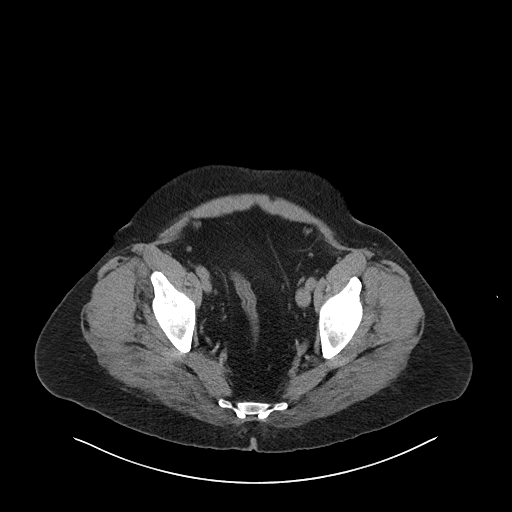
[im 28/98  soft-tissue]
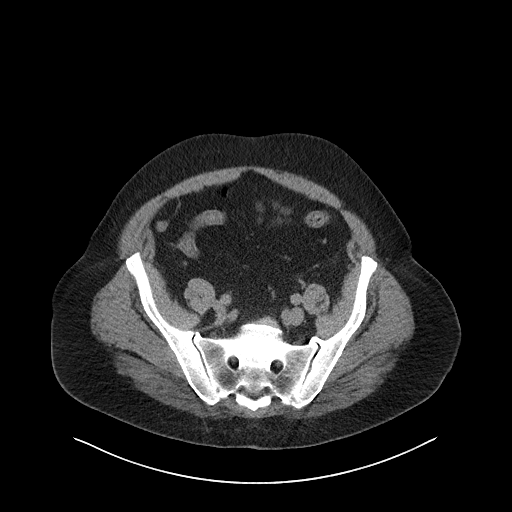
[im 35/98  soft-tissue]
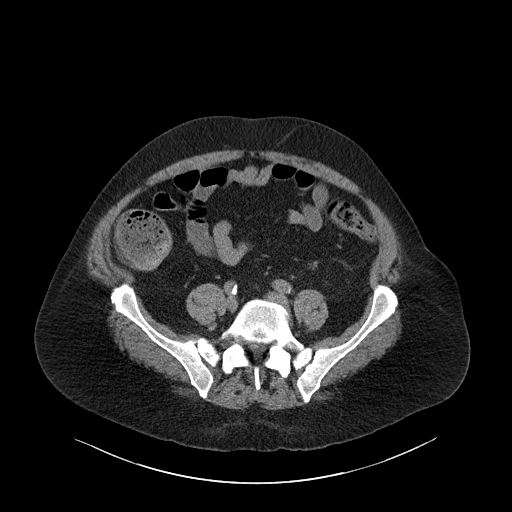
[im 43/98  soft-tissue]
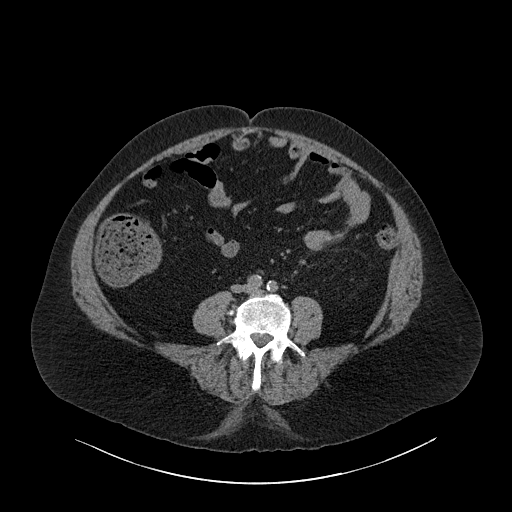
[im 51/98  soft-tissue]
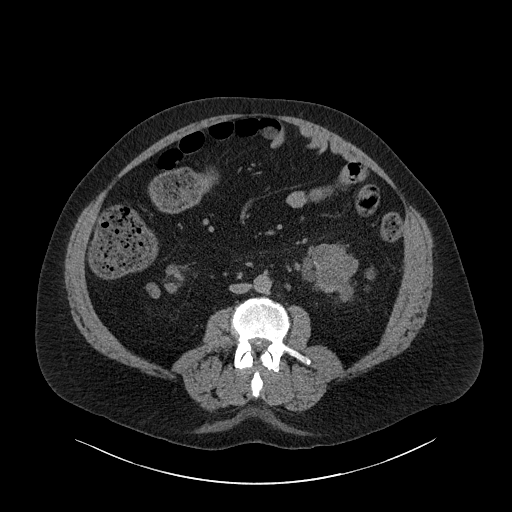
[im 55/98  soft-tissue]
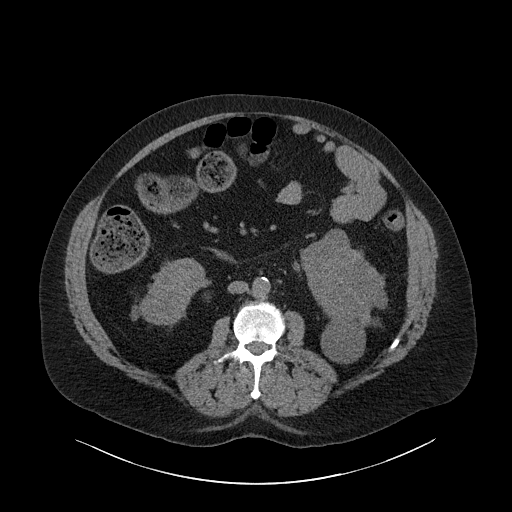
[im 63/98  soft-tissue]
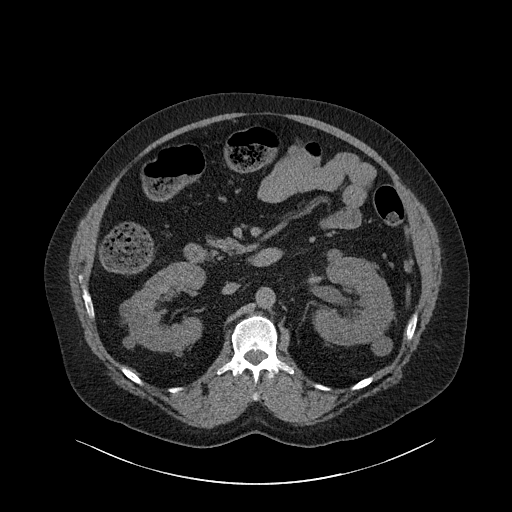
[im 63/98  bone]
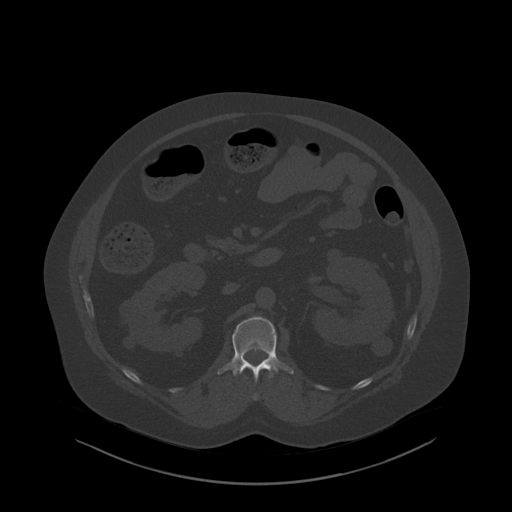
[im 70/98  soft-tissue]
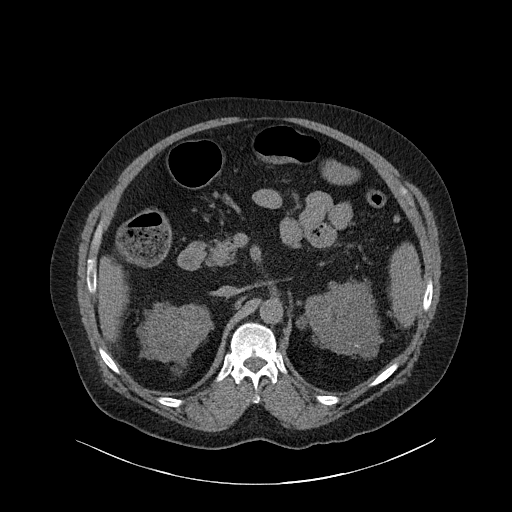
[im 78/98  soft-tissue]
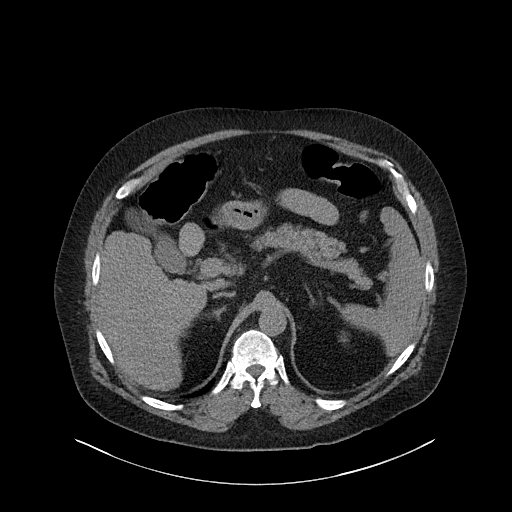
[im 86/98  soft-tissue]
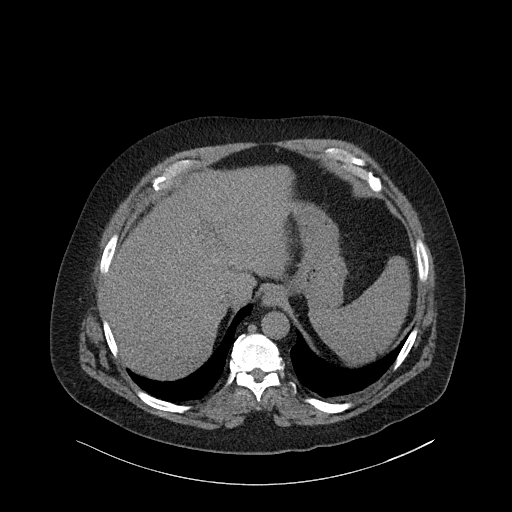
[im 94/98  soft-tissue]
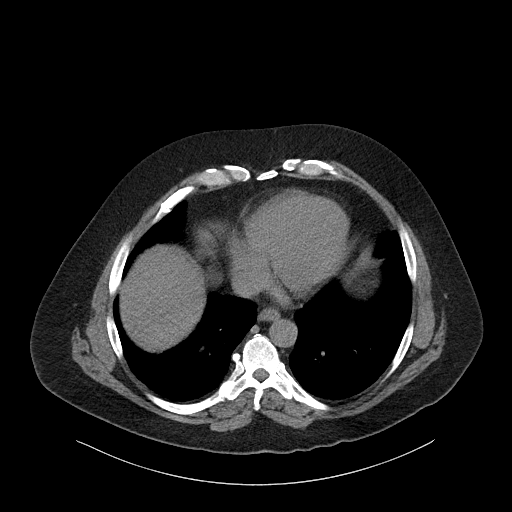

[Series 5: renal stone 3.0 cor · coronal · 0.98mm/px · 3 of 134 slices shown]
[im 45/134  soft-tissue]
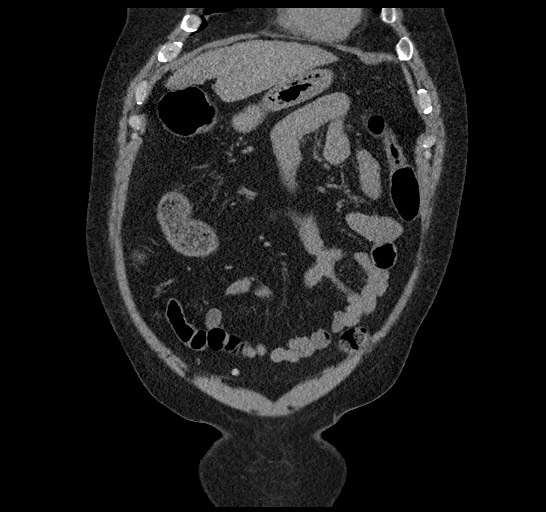
[im 60/134  soft-tissue]
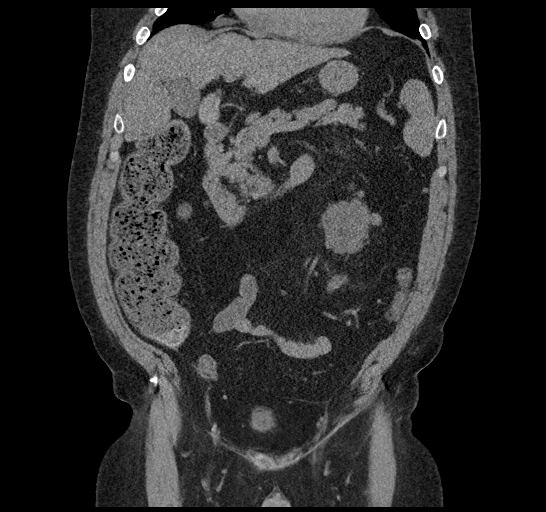
[im 74/134  soft-tissue]
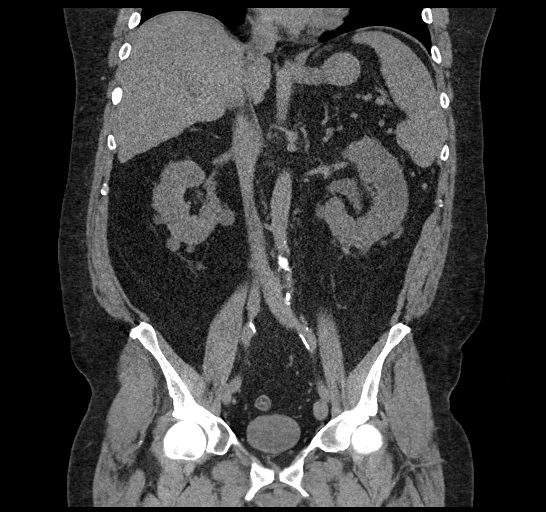

[16 of 46 positions shown; findings below may reference images not displayed]

FINDINGS: Lower chest: Minimal bibasilar atelectasis.

Hepatobiliary: Mildly nodular liver contours with a prominent
lateral segment left lobe and caudate lobe. The right lobe is
relatively small. Normal appearing gallbladder.

Pancreas: Unremarkable. No pancreatic ductal dilatation or
surrounding inflammatory changes.

Spleen: The spleen is borderline enlarged with a mild interval
decrease in size.

Adrenals/Urinary Tract: Multiple bilateral renal cysts are again
demonstrated. There are 2 small calculi in the lower pole of the
right kidney, the larger measuring 6 mm in maximum diameter. There
is also a tiny calculus in the mid to upper right kidney. There is a
small amount of calcification associated with left renal cysts. Also
demonstrated is a 5 mm calculus at the left ureteropelvic junction
with mild dilatation of the left renal collecting system. Otherwise,
no ureteral or bladder calculi are seen. Normal appearing adrenal
glands.

Stomach/Bowel: Stomach is within normal limits. Appendix appears
normal. No evidence of bowel wall thickening, distention, or
inflammatory changes.

Vascular/Lymphatic: Atheromatous arterial calcifications without
aneurysm. No enlarged lymph nodes.

Reproductive: Prostate is unremarkable.

Other: Small left inguinal hernia containing fat.

Musculoskeletal: Lumbar and lower thoracic spine degenerative
changes.
IMPRESSION: 1. 5 mm left UPJ calculus causing mild left hydronephrosis.
2. Small, nonobstructing right renal calculi.
3. Stable changes of cirrhosis of the liver.
4. Borderline splenomegaly with a mild decrease in size.
